# Patient Record
Sex: Female | Born: 1960 | Smoking: Never smoker
Health system: Southern US, Community
[De-identification: ages and names within clinical notes are randomized; demographics above are authoritative.]

## PROBLEM LIST (undated history)

## (undated) DIAGNOSIS — I10 Essential (primary) hypertension: Secondary | ICD-10-CM

## (undated) DIAGNOSIS — Z86711 Personal history of pulmonary embolism: Secondary | ICD-10-CM

## (undated) HISTORY — DX: Personal history of pulmonary embolism: Z86.711

## (undated) HISTORY — DX: Essential (primary) hypertension: I10

---

## 2018-09-08 ENCOUNTER — Ambulatory Visit (INDEPENDENT_AMBULATORY_CARE_PROVIDER_SITE_OTHER): Payer: Managed Care, Other (non HMO)

## 2018-09-08 ENCOUNTER — Other Ambulatory Visit: Payer: Self-pay | Admitting: Podiatry

## 2018-09-08 ENCOUNTER — Encounter: Payer: Self-pay | Admitting: Podiatry

## 2018-09-08 ENCOUNTER — Ambulatory Visit: Payer: Managed Care, Other (non HMO) | Admitting: Podiatry

## 2018-09-08 ENCOUNTER — Other Ambulatory Visit: Payer: Self-pay

## 2018-09-08 VITALS — Resp 16

## 2018-09-08 DIAGNOSIS — M79671 Pain in right foot: Secondary | ICD-10-CM

## 2018-09-08 DIAGNOSIS — M779 Enthesopathy, unspecified: Secondary | ICD-10-CM

## 2018-09-08 DIAGNOSIS — M778 Other enthesopathies, not elsewhere classified: Secondary | ICD-10-CM

## 2018-09-08 DIAGNOSIS — D169 Benign neoplasm of bone and articular cartilage, unspecified: Secondary | ICD-10-CM

## 2018-09-08 MED ORDER — TRIAMCINOLONE ACETONIDE 10 MG/ML IJ SUSP
10.0000 mg | Freq: Once | INTRAMUSCULAR | Status: AC
  Administered 2018-09-08: 10 mg

## 2018-09-08 NOTE — Progress Notes (Signed)
   Subjective:    Patient ID: Dana Donovan, female    DOB: 1960-09-12, 58 y.o.   MRN: [id number]  HPI    Review of Systems  All other systems reviewed and are negative.      Objective:   Physical Exam        Assessment & Plan:

## 2018-09-09 NOTE — Progress Notes (Signed)
Subjective:   Patient ID: Dana Donovan, female   DOB: 58 y.o.   MRN: [id number]   HPI Patient presents stating that she is having a lot of pain on top of her right foot she is had this previously and the last bout has been over the last couple weeks.  States it is very sore and makes it hard to wear shoe gear comfortably and she does not remember injury.  Patient does not smoke and likes to be active   Review of Systems  All other systems reviewed and are negative.       Objective:  Physical Exam Vitals signs and nursing note reviewed.  Constitutional:      Appearance: She is well-developed.  Pulmonary:     Effort: Pulmonary effort is normal.  Musculoskeletal: Normal range of motion.  Skin:    General: Skin is warm.  Neurological:     Mental Status: She is alert.     Neurovascular status intact muscle strength is adequate range of motion within normal limits with patient found to have exquisite discomfort on top of the right foot that is in the midtarsal joint and very inflamed and making it hard to wear shoe gear comfortably.  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

## 2019-03-25 ENCOUNTER — Other Ambulatory Visit: Payer: Self-pay | Admitting: Obstetrics and Gynecology

## 2019-03-25 ENCOUNTER — Other Ambulatory Visit (HOSPITAL_COMMUNITY)
Admission: RE | Admit: 2019-03-25 | Discharge: 2019-03-25 | Disposition: A | Discharge status: Home / Self Care | Payer: Managed Care, Other (non HMO) | Source: Ambulatory Visit | Attending: Obstetrics and Gynecology | Admitting: Obstetrics and Gynecology

## 2019-03-25 DIAGNOSIS — Z01419 Encounter for gynecological examination (general) (routine) without abnormal findings: Secondary | ICD-10-CM | POA: Diagnosis not present

## 2019-03-31 LAB — CYTOLOGY - PAP
Adequacy: ABSENT
Diagnosis: NEGATIVE
High risk HPV: NEGATIVE
Molecular Disclaimer: 56
Molecular Disclaimer: NORMAL

## 2019-05-24 ENCOUNTER — Other Ambulatory Visit: Payer: Self-pay

## 2019-05-24 DIAGNOSIS — Z20822 Contact with and (suspected) exposure to covid-19: Secondary | ICD-10-CM

## 2019-05-26 LAB — NOVEL CORONAVIRUS, NAA: SARS-CoV-2, NAA: NOT DETECTED

## 2019-11-09 ENCOUNTER — Ambulatory Visit: Payer: Managed Care, Other (non HMO) | Admitting: Podiatry

## 2019-11-09 ENCOUNTER — Encounter: Payer: Self-pay | Admitting: Podiatry

## 2019-11-09 ENCOUNTER — Ambulatory Visit (INDEPENDENT_AMBULATORY_CARE_PROVIDER_SITE_OTHER): Payer: Managed Care, Other (non HMO)

## 2019-11-09 ENCOUNTER — Other Ambulatory Visit: Payer: Self-pay

## 2019-11-09 ENCOUNTER — Other Ambulatory Visit: Payer: Self-pay | Admitting: Podiatry

## 2019-11-09 VITALS — Temp 97.3°F

## 2019-11-09 DIAGNOSIS — M79671 Pain in right foot: Secondary | ICD-10-CM

## 2019-11-09 DIAGNOSIS — M778 Other enthesopathies, not elsewhere classified: Secondary | ICD-10-CM

## 2019-11-09 NOTE — Progress Notes (Signed)
Subjective:   Patient ID: Dana Donovan, female   DOB: 59 y.o.   MRN: [id number]   HPI Patient states she did pretty well with the procedure but she needs to have it worked on again as the knot has been painful   ROS      Objective:  Physical Exam  Neurovascular status intact with patient found to have exostosis dorsal midtarsal joint right which is inflamed and she feels like it is slightly worse than previous but it was better for around 8 months      Assessment:  Extensor tendinitis right with bone spur formation     Plan:  H&P reviewed condition reviewed x-rays and today I went ahead and discussed exostectomy and removal of tarsal exostosis.  We will get a hold off currently and I went ahead and I did dorsal injection of the extensor tendon complex 3 mg Kenalog 5 mg Xylocaine advised on diclofenac gel and reappoint to recheck when symptomatic understanding surgery may be necessary at 1 point  X-rays indicate that there is spurring of the midtarsal joint right

## 2020-06-25 ENCOUNTER — Other Ambulatory Visit: Payer: Self-pay | Admitting: Internal Medicine

## 2020-06-25 DIAGNOSIS — Z1231 Encounter for screening mammogram for malignant neoplasm of breast: Secondary | ICD-10-CM

## 2020-08-01 ENCOUNTER — Other Ambulatory Visit: Payer: Self-pay

## 2020-08-01 ENCOUNTER — Ambulatory Visit
Admission: RE | Admit: 2020-08-01 | Discharge: 2020-08-01 | Disposition: A | Discharge status: Home / Self Care | Payer: Managed Care, Other (non HMO) | Source: Ambulatory Visit | Attending: Internal Medicine | Admitting: Internal Medicine

## 2020-08-01 DIAGNOSIS — Z1231 Encounter for screening mammogram for malignant neoplasm of breast: Secondary | ICD-10-CM

## 2021-07-24 ENCOUNTER — Other Ambulatory Visit: Payer: Self-pay | Admitting: Internal Medicine

## 2021-07-24 DIAGNOSIS — Z1231 Encounter for screening mammogram for malignant neoplasm of breast: Secondary | ICD-10-CM

## 2021-08-02 ENCOUNTER — Ambulatory Visit
Admission: RE | Admit: 2021-08-02 | Discharge: 2021-08-02 | Disposition: A | Discharge status: Home / Self Care | Payer: Managed Care, Other (non HMO) | Source: Ambulatory Visit | Attending: Internal Medicine | Admitting: Internal Medicine

## 2021-08-02 DIAGNOSIS — Z1231 Encounter for screening mammogram for malignant neoplasm of breast: Secondary | ICD-10-CM

## 2022-02-13 ENCOUNTER — Ambulatory Visit (INDEPENDENT_AMBULATORY_CARE_PROVIDER_SITE_OTHER): Payer: Managed Care, Other (non HMO)

## 2022-02-13 ENCOUNTER — Ambulatory Visit: Payer: Managed Care, Other (non HMO) | Admitting: Podiatry

## 2022-02-13 DIAGNOSIS — M778 Other enthesopathies, not elsewhere classified: Secondary | ICD-10-CM | POA: Diagnosis not present

## 2022-02-13 DIAGNOSIS — M79671 Pain in right foot: Secondary | ICD-10-CM

## 2022-02-13 MED ORDER — TRIAMCINOLONE ACETONIDE 10 MG/ML IJ SUSP
10.0000 mg | Freq: Once | INTRAMUSCULAR | Status: AC
  Administered 2022-02-13: 10 mg

## 2022-02-14 NOTE — Progress Notes (Signed)
Subjective:   Patient ID: Dana Donovan, female   DOB: 61 y.o.   MRN: [id number]   HPI Patient presents stating she has developed discomfort again on the top of her right foot and states it was good for a relative extended period of time   ROS      Objective:  Physical Exam  Neurovascular status intact with patient found to have inflammation pain of the extensor complex right with fluid buildup noted with moderate swelling     Assessment:  Probability for midfoot arthritis right with extensor tendinitis symptoms     Plan:  H&P x-ray reviewed sterile prep injected the extensor complex 3 mg dexamethasone Kenalog 5 mg Xylocaine advised on wider shoes reduced activity explained risk of injection prior to procedure  X-rays indicate there is quite a bit of roughness in the area with spur formation no indications of significant change from previous x-ray

## 2022-02-26 ENCOUNTER — Other Ambulatory Visit: Payer: Self-pay | Admitting: Podiatry

## 2022-02-26 DIAGNOSIS — M778 Other enthesopathies, not elsewhere classified: Secondary | ICD-10-CM

## 2022-12-10 ENCOUNTER — Ambulatory Visit (INDEPENDENT_AMBULATORY_CARE_PROVIDER_SITE_OTHER): Payer: Managed Care, Other (non HMO)

## 2022-12-10 ENCOUNTER — Ambulatory Visit: Payer: Managed Care, Other (non HMO) | Admitting: Podiatry

## 2022-12-10 ENCOUNTER — Encounter: Payer: Self-pay | Admitting: Podiatry

## 2022-12-10 DIAGNOSIS — M778 Other enthesopathies, not elsewhere classified: Secondary | ICD-10-CM

## 2022-12-10 DIAGNOSIS — M79671 Pain in right foot: Secondary | ICD-10-CM | POA: Diagnosis not present

## 2022-12-10 MED ORDER — TRIAMCINOLONE ACETONIDE 10 MG/ML IJ SUSP
10.0000 mg | Freq: Once | INTRAMUSCULAR | Status: AC
  Administered 2022-12-10: 10 mg

## 2022-12-10 NOTE — Progress Notes (Signed)
Subjective:   Patient ID: Dana Donovan, female   DOB: 62 y.o.   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  Reappoint to recheck as needed

## 2023-03-05 ENCOUNTER — Other Ambulatory Visit (HOSPITAL_COMMUNITY): Payer: Self-pay

## 2023-03-05 ENCOUNTER — Other Ambulatory Visit: Payer: Self-pay

## 2023-03-05 MED ORDER — WEGOVY 1 MG/0.5ML ~~LOC~~ SOAJ
1.0000 mg | SUBCUTANEOUS | 1 refills | Status: DC
Start: 2023-03-05 — End: 2023-07-28
  Filled 2023-03-05 – 2023-03-10 (×3): qty 2, 28d supply, fill #0

## 2023-03-06 ENCOUNTER — Other Ambulatory Visit (HOSPITAL_COMMUNITY): Payer: Self-pay

## 2023-03-10 ENCOUNTER — Other Ambulatory Visit (HOSPITAL_COMMUNITY): Payer: Self-pay

## 2023-03-10 ENCOUNTER — Other Ambulatory Visit: Payer: Self-pay | Admitting: Nurse Practitioner

## 2023-03-10 DIAGNOSIS — Z1231 Encounter for screening mammogram for malignant neoplasm of breast: Secondary | ICD-10-CM

## 2023-03-13 ENCOUNTER — Other Ambulatory Visit: Payer: Self-pay | Admitting: Nurse Practitioner

## 2023-03-13 DIAGNOSIS — Z780 Asymptomatic menopausal state: Secondary | ICD-10-CM

## 2023-04-01 ENCOUNTER — Ambulatory Visit
Admission: RE | Admit: 2023-04-01 | Discharge: 2023-04-01 | Disposition: A | Discharge status: Home / Self Care | Payer: Managed Care, Other (non HMO) | Source: Ambulatory Visit | Attending: Nurse Practitioner | Admitting: Nurse Practitioner

## 2023-04-01 DIAGNOSIS — Z1231 Encounter for screening mammogram for malignant neoplasm of breast: Secondary | ICD-10-CM

## 2023-04-02 ENCOUNTER — Other Ambulatory Visit (HOSPITAL_COMMUNITY): Payer: Self-pay

## 2023-04-02 MED ORDER — WEGOVY 1.7 MG/0.75ML ~~LOC~~ SOAJ
1.7000 mg | SUBCUTANEOUS | 0 refills | Status: DC
  Filled 2023-04-02: qty 3, 28d supply, fill #0

## 2023-04-06 ENCOUNTER — Other Ambulatory Visit: Payer: Self-pay | Admitting: Nurse Practitioner

## 2023-04-06 ENCOUNTER — Other Ambulatory Visit (HOSPITAL_COMMUNITY): Payer: Self-pay

## 2023-04-06 DIAGNOSIS — R928 Other abnormal and inconclusive findings on diagnostic imaging of breast: Secondary | ICD-10-CM

## 2023-04-15 ENCOUNTER — Ambulatory Visit
Admission: RE | Admit: 2023-04-15 | Discharge: 2023-04-15 | Disposition: A | Discharge status: Home / Self Care | Payer: Managed Care, Other (non HMO) | Source: Ambulatory Visit | Attending: Nurse Practitioner | Admitting: Nurse Practitioner

## 2023-04-15 ENCOUNTER — Ambulatory Visit: Payer: Managed Care, Other (non HMO)

## 2023-04-15 DIAGNOSIS — R928 Other abnormal and inconclusive findings on diagnostic imaging of breast: Secondary | ICD-10-CM

## 2023-05-06 ENCOUNTER — Other Ambulatory Visit (HOSPITAL_COMMUNITY): Payer: Self-pay

## 2023-05-06 MED ORDER — WEGOVY 2.4 MG/0.75ML ~~LOC~~ SOAJ
2.4000 mg | SUBCUTANEOUS | 2 refills | Status: DC
  Filled 2023-05-06: qty 3, 28d supply, fill #0
  Filled 2023-06-03: qty 3, 28d supply, fill #1

## 2023-05-07 ENCOUNTER — Other Ambulatory Visit: Payer: Self-pay

## 2023-05-11 ENCOUNTER — Other Ambulatory Visit (HOSPITAL_COMMUNITY): Payer: Self-pay

## 2023-06-05 ENCOUNTER — Other Ambulatory Visit (HOSPITAL_COMMUNITY): Payer: Self-pay

## 2023-06-17 ENCOUNTER — Other Ambulatory Visit (HOSPITAL_COMMUNITY): Payer: Self-pay

## 2023-06-17 MED ORDER — WEGOVY 2.4 MG/0.75ML ~~LOC~~ SOAJ: 2.4000 mg | SUBCUTANEOUS | 2 refills | Status: DC

## 2023-06-30 ENCOUNTER — Telehealth: Payer: Managed Care, Other (non HMO)

## 2023-06-30 ENCOUNTER — Ambulatory Visit: Payer: Managed Care, Other (non HMO)

## 2023-06-30 DIAGNOSIS — U071 COVID-19: Secondary | ICD-10-CM | POA: Diagnosis not present

## 2023-06-30 MED ORDER — COVID-19 AT HOME ANTIGEN TEST VI KIT: PACK | 0 refills | Status: DC

## 2023-06-30 MED ORDER — PROMETHAZINE-DM 6.25-15 MG/5ML PO SYRP: 5.0000 mL | ORAL_SOLUTION | Freq: Four times a day (QID) | ORAL | 0 refills | Status: DC | PRN

## 2023-06-30 MED ORDER — NIRMATRELVIR/RITONAVIR (PAXLOVID)TABLET: 3.0000 | ORAL_TABLET | Freq: Two times a day (BID) | ORAL | 0 refills | Status: DC

## 2023-06-30 MED ORDER — FLUTICASONE PROPIONATE 50 MCG/ACT NA SUSP: 2.0000 | Freq: Every day | NASAL | 0 refills | Status: AC

## 2023-06-30 NOTE — Patient Instructions (Signed)
 Karna Hoar, thank you for joining [name], PA-C for today's virtual visit.  While this provider is not your primary care provider (PCP), if your PCP is located in our provider database this encounter information will be shared with them immediately following your visit.   A [facility] MyChart account gives you access to today's visit and all your visits, tests, and labs performed at [facility]  click here if you don't have a  MyChart account or go to [email or web address]  Consent: (Patient) Dana Donovan provided verbal consent for this virtual visit at the beginning of the encounter.  Current Medications:  Current Outpatient Medications:    COVID-19 At Home Antigen Test KIT, Use as directed., Disp: 2 each, Rfl: 0   fluticasone  (FLONASE ) 50 MCG/ACT nasal spray, Place 2 sprays into both nostrils daily., Disp: 16 g, Rfl: 0   nirmatrelvir /ritonavir  (PAXLOVID ) 20 x 150 MG & 10 x 100MG  TABS, Take 3 tablets by mouth 2 (two) times daily for 5 days. (Take nirmatrelvir  150 mg two tablets twice daily for 5 days and ritonavir  100 mg one tablet twice daily for 5 days), Disp: 30 tablet, Rfl: 0   promethazine -dextromethorphan (PROMETHAZINE -DM) 6.25-15 MG/5ML syrup, Take 5 mLs by mouth 4 (four) times daily as needed., Disp: 118 mL, Rfl: 0   albuterol  (PROVENTIL ) (2.5 MG/3ML) 0.083% nebulizer solution, , Disp: , Rfl:    BREO ELLIPTA 200-25 MCG/INH AEPB, , Disp: , Rfl:    hydrALAZINE  (APRESOLINE ) 25 MG tablet, TK 1 T PO TID WF, Disp: , Rfl:    lisinopril  (PRINIVIL ,ZESTRIL ) 40 MG tablet, , Disp: , Rfl:    oxybutynin  (DITROPAN -XL) 10 MG 24 hr tablet, , Disp: , Rfl:    Semaglutide -Weight Management (WEGOVY ) 1 MG/0.5ML SOAJ, Inject 1 mg into the skin once a week., Disp: 2 mL, Rfl: 1   Semaglutide -Weight Management (WEGOVY ) 1.7 MG/0.75ML SOAJ, Inject 1.7 mg into the skin once a week., Disp: 3 mL, Rfl: 0   Semaglutide -Weight Management (WEGOVY ) 2.4  MG/0.75ML SOAJ, Inject 2.4 mg into the skin once a week., Disp: 3 mL, Rfl: 2   Semaglutide -Weight Management (WEGOVY ) 2.4 MG/0.75ML SOAJ, Inject 2.4 mg into the skin every 7 (seven) days., Disp: 2 mL, Rfl: 2   triamterene -hydrochlorothiazide  (MAXZIDE -25) 37.5-25 MG tablet, Take 1 tablet by mouth daily., Disp: , Rfl:    Medications ordered in this encounter:  Meds ordered this encounter  Medications   nirmatrelvir /ritonavir  (PAXLOVID ) 20 x 150 MG & 10 x 100MG  TABS    Sig: Take 3 tablets by mouth 2 (two) times daily for 5 days. (Take nirmatrelvir  150 mg two tablets twice daily for 5 days and ritonavir  100 mg one tablet twice daily for 5 days)    Dispense:  30 tablet    Refill:  0    Supervising Provider:   BLAISE ALEENE KIDD [[id number]]   promethazine -dextromethorphan (PROMETHAZINE -DM) 6.25-15 MG/5ML syrup    Sig: Take 5 mLs by mouth 4 (four) times daily as needed.    Dispense:  118 mL    Refill:  0    Supervising Provider:   LAMPTEY, PHILIP O [id number]   fluticasone  (FLONASE ) 50 MCG/ACT nasal spray    Sig: Place 2 sprays into both nostrils daily.    Dispense:  16 g    Refill:  0    Supervising Provider:   BLAISE ALEENE KIDD [[id number]]   COVID-19 At Home Antigen Test KIT    Sig: Use as directed.    Dispense:  2 each  Refill:  0    Supervising Provider:   BLAISE ALEENE KIDD [[id number]]     *If you need refills on other medications prior to your next appointment, please contact your pharmacy*  Follow-Up: Call back or seek an in-person evaluation if the symptoms worsen or if the condition fails to improve as anticipated.  [facility] [phone]  Care Instructions: Can take to lessen severity: Vit C 500mg  twice daily Quercertin 250-500mg  twice daily Zinc 75-100mg  daily Melatonin 3-6 mg at bedtime Vit D3 1000-2000 IU daily Aspirin 81 mg daily with food Optional: Famotidine 20mg  daily Also can add tylenol /ibuprofen as needed for fevers and body aches May add Mucinex  or Mucinex DM as needed for cough/congestion    Isolation Instructions: You are to isolate at home until you have been fever free for at least 24 hours without a fever-reducing medication, and symptoms have been steadily improving for 24 hours. At that time,  you can end isolation but need to mask for an additional 5 days.   If you must be around other household members who do not have symptoms, you need to make sure that both you and the family members are masking consistently with a high-quality mask.  If you note any worsening of symptoms despite treatment, please seek an in-person evaluation ASAP. If you note any significant shortness of breath or any chest pain, please seek ER evaluation. Please do not delay care!   COVID-19: What to Do if You Are Sick If you test positive and are an older adult or someone who is at high risk of getting very sick from COVID-19, treatment may be available. Contact a healthcare provider right away after a positive test to determine if you are eligible, even if your symptoms are mild right now. You can also visit a Test to Treat location and, if eligible, receive a prescription from a provider. Don't delay: Treatment must be started within the first few days to be effective. If you have a fever, cough, or other symptoms, you might have COVID-19. Most people have mild illness and are able to recover at home. If you are sick: Keep track of your symptoms. If you have an emergency warning sign (including trouble breathing), call 911. Steps to help prevent the spread of COVID-19 if you are sick If you are sick with COVID-19 or think you might have COVID-19, follow the steps below to care for yourself and to help protect other people in your home and community. Stay home except to get medical care Stay home. Most people with COVID-19 have mild illness and can recover at home without medical care. Do not leave your home, except to get medical care. Do not visit public  areas and do not go to places where you are unable to wear a mask. Take care of yourself. Get rest and stay hydrated. Take over-the-counter medicines, such as acetaminophen , to help you feel better. Stay in touch with your doctor. Call before you get medical care. Be sure to get care if you have trouble breathing, or have any other emergency warning signs, or if you think it is an emergency. Avoid public transportation, ride-sharing, or taxis if possible. Get tested If you have symptoms of COVID-19, get tested. While waiting for test results, stay away from others, including staying apart from those living in your household. Get tested as soon as possible after your symptoms start. Treatments may be available for people with COVID-19 who are at risk for becoming  very sick. Don't delay: Treatment must be started early to be effective--some treatments must begin within 5 days of your first symptoms. Contact your healthcare provider right away if your test result is positive to determine if you are eligible. Self-tests are one of several options for testing for the virus that causes COVID-19 and may be more convenient than laboratory-based tests and point-of-care tests. Ask your healthcare provider or your local health department if you need help interpreting your test results. You can visit your state, tribal, local, and territorial health department's website to look for the latest local information on testing sites. Separate yourself from other people As much as possible, stay in a specific room and away from other people and pets in your home. If possible, you should use a separate bathroom. If you need to be around other people or animals in or outside of the home, wear a well-fitting mask. Tell your close contacts that they may have been exposed to COVID-19. An infected person can spread COVID-19 starting 48 hours (or 2 days) before the person has any symptoms or tests positive. By letting your close  contacts know they may have been exposed to COVID-19, you are helping to protect everyone. See COVID-19 and Animals if you have questions about pets. If you are diagnosed with COVID-19, someone from the health department may call you. Answer the call to slow the spread. Monitor your symptoms Symptoms of COVID-19 include fever, cough, or other symptoms. Follow care instructions from your healthcare provider and local health department. Your local health authorities may give instructions on checking your symptoms and reporting information. When to seek emergency medical attention Look for emergency warning signs* for COVID-19. If someone is showing any of these signs, seek emergency medical care immediately: Trouble breathing Persistent pain or pressure in the chest New confusion Inability to wake or stay awake Pale, gray, or blue-colored skin, lips, or nail beds, depending on skin tone *This list is not all possible symptoms. Please call your medical provider for any other symptoms that are severe or concerning to you. Call 911 or call ahead to your local emergency facility: Notify the operator that you are seeking care for someone who has or may have COVID-19. Call ahead before visiting your doctor Call ahead. Many medical visits for routine care are being postponed or done by phone or telemedicine. If you have a medical appointment that cannot be postponed, call your doctor's office, and tell them you have or may have COVID-19. This will help the office protect themselves and other patients. If you are sick, wear a well-fitting mask You should wear a mask if you must be around other people or animals, including pets (even at home). Wear a mask with the best fit, protection, and comfort for you. You don't need to wear the mask if you are alone. If you can't put on a mask (because of trouble breathing, for example), cover your coughs and sneezes in some other way. Try to stay at least 6 feet away  from other people. This will help protect the people around you. Masks should not be placed on young children under age 64 years, anyone who has trouble breathing, or anyone who is not able to remove the mask without help. Cover your coughs and sneezes Cover your mouth and nose with a tissue when you cough or sneeze. Throw away used tissues in a lined trash can. Immediately wash your hands with soap and water for at least 20 seconds. If  soap and water are not available, clean your hands with an alcohol-based hand sanitizer that contains at least 60% alcohol. Clean your hands often Wash your hands often with soap and water for at least 20 seconds. This is especially important after blowing your nose, coughing, or sneezing; going to the bathroom; and before eating or preparing food. Use hand sanitizer if soap and water are not available. Use an alcohol-based hand sanitizer with at least 60% alcohol, covering all surfaces of your hands and rubbing them together until they feel dry. Soap and water are the best option, especially if hands are visibly dirty. Avoid touching your eyes, nose, and mouth with unwashed hands. Handwashing Tips Avoid sharing personal household items Do not share dishes, drinking glasses, cups, eating utensils, towels, or bedding with other people in your home. Wash these items thoroughly after using them with soap and water or put in the dishwasher. Clean surfaces in your home regularly Clean and disinfect high-touch surfaces (for example, doorknobs, tables, handles, light switches, and countertops) in your sick room and bathroom. In shared spaces, you should clean and disinfect surfaces and items after each use by the person who is ill. If you are sick and cannot clean, a caregiver or other person should only clean and disinfect the area around you (such as your bedroom and bathroom) on an as needed basis. Your caregiver/other person should wait as long as possible (at least  several hours) and wear a mask before entering, cleaning, and disinfecting shared spaces that you use. Clean and disinfect areas that may have blood, stool, or body fluids on them. Use household cleaners and disinfectants. Clean visible dirty surfaces with household cleaners containing soap or detergent. Then, use a household disinfectant. Use a product from [employer] List N: Disinfectants for Coronavirus (COVID-19). Be sure to follow the instructions on the label to ensure safe and effective use of the product. Many products recommend keeping the surface wet with a disinfectant for a certain period of time (look at contact time on the product label). You may also need to wear personal protective equipment, such as gloves, depending on the directions on the product label. Immediately after disinfecting, wash your hands with soap and water for 20 seconds. For completed guidance on cleaning and disinfecting your home, visit Complete Disinfection Guidance. Take steps to improve ventilation at home Improve ventilation (air flow) at home to help prevent from spreading COVID-19 to other people in your household. Clear out COVID-19 virus particles in the air by opening windows, using air filters, and turning on fans in your home. Use this interactive tool to learn how to improve air flow in your home. When you can be around others after being sick with COVID-19 Deciding when you can be around others is different for different situations. Find out when you can safely end home isolation. For any additional questions about your care, contact your healthcare provider or state or local health department. 09/18/2020 Content source: [facility] ([facility]), Division of Viral Diseases This information is not intended to replace advice given to you by your health care provider. Make sure you discuss any questions you have with your health care provider. Document Revised:  11/01/2020 Document Reviewed: 11/01/2020 Elsevier Patient Education  2022 [employer].     If you have been instructed to have an in-person evaluation today at a local Urgent Care facility, please use the link below. It will take you to a list of all of our  available [facility], including address, phone number and hours of operation. Please do not delay care.  [facility]  If you or a family member do not have a primary care provider, use the link below to schedule a visit and establish care. When you choose a [facility] primary care physician or advanced practice provider, you gain a long-term partner in health. Find a Primary Care Provider  Learn more about [facility]'s in-office and virtual care options: [facility] - Get Care Now

## 2023-06-30 NOTE — Progress Notes (Signed)
 Virtual Visit Consent   Dana Donovan, you are scheduled for a virtual visit with a [facility] provider today. Just as with appointments in the office, your consent must be obtained to participate. Your consent will be active for this visit and any virtual visit you may have with one of our providers in the next 365 days. If you have a MyChart account, a copy of this consent can be sent to you electronically.  As this is a virtual visit, video technology does not allow for your provider to perform a traditional examination. This may limit your provider's ability to fully assess your condition. If your provider identifies any concerns that need to be evaluated in person or the need to arrange testing (such as labs, EKG, etc.), we will make arrangements to do so. Although advances in technology are sophisticated, we cannot ensure that it will always work on either your end or our end. If the connection with a video visit is poor, the visit may have to be switched to a telephone visit. With either a video or telephone visit, we are not always able to ensure that we have a secure connection.  By engaging in this virtual visit, you consent to the provision of healthcare and authorize for your insurance to be billed (if applicable) for the services provided during this visit. Depending on your insurance coverage, you may receive a charge related to this service.  I need to obtain your verbal consent now. Are you willing to proceed with your visit today? Dana Donovan has provided verbal consent on 06/30/2023 for a virtual visit (video or telephone). Dana CHRISTELLA Dickinson, PA-C  Date: 06/30/2023 2:30 PM  Virtual Visit via Video Note   I, Dana Donovan, connected with  Dana Donovan  ([id number], 1961-06-12) on 06/30/23 at  2:15 PM EST by a video-enabled telemedicine application and verified that I am speaking with the correct person using two identifiers.  Location: Patient:  Virtual Visit Location Patient: Home Provider: Virtual Visit Location Provider: Home Office   I discussed the limitations of evaluation and management by telemedicine and the availability of in person appointments. The patient expressed understanding and agreed to proceed.    History of Present Illness: Dana Donovan is a 62 y.o. who identifies as a female who was assigned female at birth, and is being seen today for Covid 64.  HPI: URI  This is a new problem. Episode onset: Tested positive for Covid 19 yesterday; Symptoms started about 2 days ago. The problem has been gradually worsening. Maximum temperature: subjective fevers with chills and body aches. The fever has been present for 1 to 2 days. Associated symptoms include congestion, coughing (productive), headaches, a plugged ear sensation, rhinorrhea (and post nasal drainage), sinus pain, a sore throat and wheezing. Pertinent negatives include no chest pain, diarrhea, ear pain, nausea or vomiting. Associated symptoms comments: Body aches, chills, dry mouth. Treatments tried: gargling salt water. The treatment provided no relief.    Has had Covid 19 previously and has used Paxlovid  successfully  Problems: There are no active problems to display for this patient.   Allergies: No Known Allergies Medications:  Current Outpatient Medications:    COVID-19 At Home Antigen Test KIT, Use as directed., Disp: 2 each, Rfl: 0   fluticasone  (FLONASE ) 50 MCG/ACT nasal spray, Place 2 sprays into both nostrils daily., Disp: 16 g, Rfl: 0   nirmatrelvir /ritonavir  (PAXLOVID ) 20 x 150 MG & 10 x 100MG  TABS, Take 3 tablets by mouth 2 (two)  times daily for 5 days. (Take nirmatrelvir  150 mg two tablets twice daily for 5 days and ritonavir  100 mg one tablet twice daily for 5 days), Disp: 30 tablet, Rfl: 0   promethazine -dextromethorphan (PROMETHAZINE -DM) 6.25-15 MG/5ML syrup, Take 5 mLs by mouth 4 (four) times daily as needed., Disp: 118 mL, Rfl: 0    albuterol  (PROVENTIL ) (2.5 MG/3ML) 0.083% nebulizer solution, , Disp: , Rfl:    BREO ELLIPTA 200-25 MCG/INH AEPB, , Disp: , Rfl:    hydrALAZINE  (APRESOLINE ) 25 MG tablet, TK 1 T PO TID WF, Disp: , Rfl:    lisinopril  (PRINIVIL ,ZESTRIL ) 40 MG tablet, , Disp: , Rfl:    oxybutynin  (DITROPAN -XL) 10 MG 24 hr tablet, , Disp: , Rfl:    Semaglutide -Weight Management (WEGOVY ) 1 MG/0.5ML SOAJ, Inject 1 mg into the skin once a week., Disp: 2 mL, Rfl: 1   Semaglutide -Weight Management (WEGOVY ) 1.7 MG/0.75ML SOAJ, Inject 1.7 mg into the skin once a week., Disp: 3 mL, Rfl: 0   Semaglutide -Weight Management (WEGOVY ) 2.4 MG/0.75ML SOAJ, Inject 2.4 mg into the skin once a week., Disp: 3 mL, Rfl: 2   Semaglutide -Weight Management (WEGOVY ) 2.4 MG/0.75ML SOAJ, Inject 2.4 mg into the skin every 7 (seven) days., Disp: 2 mL, Rfl: 2   triamterene -hydrochlorothiazide  (MAXZIDE -25) 37.5-25 MG tablet, Take 1 tablet by mouth daily., Disp: , Rfl:   Observations/Objective: Patient is well-developed, well-nourished in no acute distress.  Resting comfortably at home.  Head is normocephalic, atraumatic.  No labored breathing.  Speech is clear and coherent with logical content.  Patient is alert and oriented at baseline.    Assessment and Plan: 1. COVID-19 (Primary) - nirmatrelvir /ritonavir  (PAXLOVID ) 20 x 150 MG & 10 x 100MG  TABS; Take 3 tablets by mouth 2 (two) times daily for 5 days. (Take nirmatrelvir  150 mg two tablets twice daily for 5 days and ritonavir  100 mg one tablet twice daily for 5 days)  Dispense: 30 tablet; Refill: 0 - promethazine -dextromethorphan (PROMETHAZINE -DM) 6.25-15 MG/5ML syrup; Take 5 mLs by mouth 4 (four) times daily as needed.  Dispense: 118 mL; Refill: 0 - fluticasone  (FLONASE ) 50 MCG/ACT nasal spray; Place 2 sprays into both nostrils daily.  Dispense: 16 g; Refill: 0 - COVID-19 At Home Antigen Test KIT; Use as directed.  Dispense: 2 each; Refill: 0 - MyChart COVID-19 home monitoring program;  Future  - Continue OTC symptomatic management of choice - Will send OTC vitamins and supplement information through AVS - Paxlovid  prescribed - Promethazine  DM for cough - Flonase  for nasal congestion - Advised she can use at home albuterol  inhaler as needed for wheezing and shortness of breath - Patient enrolled in MyChart symptom monitoring - Push fluids - Rest as needed - Discussed return precautions and when to seek in-person evaluation, sent via AVS as well   Follow Up Instructions: I discussed the assessment and treatment plan with the patient. The patient was provided an opportunity to ask questions and all were answered. The patient agreed with the plan and demonstrated an understanding of the instructions.  A copy of instructions were sent to the patient via MyChart unless otherwise noted below.    The patient was advised to call back or seek an in-person evaluation if the symptoms worsen or if the condition fails to improve as anticipated.    Dana CHRISTELLA Dickinson, PA-C

## 2023-07-04 ENCOUNTER — Encounter (HOSPITAL_COMMUNITY): Payer: Self-pay

## 2023-07-04 ENCOUNTER — Other Ambulatory Visit: Payer: Self-pay

## 2023-07-04 ENCOUNTER — Emergency Department: Payer: Managed Care, Other (non HMO)

## 2023-07-04 ENCOUNTER — Emergency Department
Admission: EM | Admit: 2023-07-04 | Discharge: 2023-07-04 | Disposition: A | Discharge status: Home / Self Care | Payer: Managed Care, Other (non HMO) | Attending: Emergency Medicine | Admitting: Emergency Medicine

## 2023-07-04 ENCOUNTER — Inpatient Hospital Stay (HOSPITAL_COMMUNITY)
Admission: EM | Admit: 2023-07-04 | Discharge: 2023-07-10 | DRG: 163 | Disposition: A | Discharge status: Home / Self Care | Payer: Managed Care, Other (non HMO) | Source: Other Acute Inpatient Hospital | Attending: Family Medicine | Admitting: Family Medicine

## 2023-07-04 ENCOUNTER — Encounter: Payer: Self-pay | Admitting: Emergency Medicine

## 2023-07-04 DIAGNOSIS — I110 Hypertensive heart disease with heart failure: Secondary | ICD-10-CM | POA: Diagnosis present

## 2023-07-04 DIAGNOSIS — E871 Hypo-osmolality and hyponatremia: Secondary | ICD-10-CM | POA: Diagnosis present

## 2023-07-04 DIAGNOSIS — I82443 Acute embolism and thrombosis of tibial vein, bilateral: Secondary | ICD-10-CM | POA: Diagnosis present

## 2023-07-04 DIAGNOSIS — J9601 Acute respiratory failure with hypoxia: Secondary | ICD-10-CM | POA: Diagnosis present

## 2023-07-04 DIAGNOSIS — I82453 Acute embolism and thrombosis of peroneal vein, bilateral: Secondary | ICD-10-CM | POA: Diagnosis present

## 2023-07-04 DIAGNOSIS — I50810 Right heart failure, unspecified: Secondary | ICD-10-CM | POA: Diagnosis present

## 2023-07-04 DIAGNOSIS — R630 Anorexia: Secondary | ICD-10-CM | POA: Insufficient documentation

## 2023-07-04 DIAGNOSIS — E872 Acidosis, unspecified: Secondary | ICD-10-CM | POA: Diagnosis present

## 2023-07-04 DIAGNOSIS — I82433 Acute embolism and thrombosis of popliteal vein, bilateral: Secondary | ICD-10-CM | POA: Diagnosis present

## 2023-07-04 DIAGNOSIS — U071 COVID-19: Secondary | ICD-10-CM | POA: Diagnosis not present

## 2023-07-04 DIAGNOSIS — G4733 Obstructive sleep apnea (adult) (pediatric): Secondary | ICD-10-CM | POA: Diagnosis present

## 2023-07-04 DIAGNOSIS — E6609 Other obesity due to excess calories: Secondary | ICD-10-CM

## 2023-07-04 DIAGNOSIS — E66813 Obesity, class 3: Secondary | ICD-10-CM | POA: Diagnosis present

## 2023-07-04 DIAGNOSIS — D649 Anemia, unspecified: Secondary | ICD-10-CM

## 2023-07-04 DIAGNOSIS — U099 Post covid-19 condition, unspecified: Secondary | ICD-10-CM | POA: Diagnosis present

## 2023-07-04 DIAGNOSIS — I2609 Other pulmonary embolism with acute cor pulmonale: Secondary | ICD-10-CM | POA: Diagnosis not present

## 2023-07-04 DIAGNOSIS — R609 Edema, unspecified: Secondary | ICD-10-CM | POA: Diagnosis not present

## 2023-07-04 DIAGNOSIS — I2699 Other pulmonary embolism without acute cor pulmonale: Secondary | ICD-10-CM | POA: Diagnosis present

## 2023-07-04 DIAGNOSIS — I2489 Other forms of acute ischemic heart disease: Secondary | ICD-10-CM | POA: Diagnosis present

## 2023-07-04 DIAGNOSIS — I82413 Acute embolism and thrombosis of femoral vein, bilateral: Secondary | ICD-10-CM | POA: Diagnosis present

## 2023-07-04 DIAGNOSIS — Z79899 Other long term (current) drug therapy: Secondary | ICD-10-CM

## 2023-07-04 DIAGNOSIS — I2692 Saddle embolus of pulmonary artery without acute cor pulmonale: Principal | ICD-10-CM | POA: Diagnosis present

## 2023-07-04 DIAGNOSIS — R739 Hyperglycemia, unspecified: Secondary | ICD-10-CM | POA: Diagnosis present

## 2023-07-04 DIAGNOSIS — Z7985 Long-term (current) use of injectable non-insulin antidiabetic drugs: Secondary | ICD-10-CM

## 2023-07-04 DIAGNOSIS — N179 Acute kidney failure, unspecified: Secondary | ICD-10-CM | POA: Diagnosis present

## 2023-07-04 DIAGNOSIS — I2602 Saddle embolus of pulmonary artery with acute cor pulmonale: Secondary | ICD-10-CM | POA: Diagnosis present

## 2023-07-04 DIAGNOSIS — Z6839 Body mass index (BMI) 39.0-39.9, adult: Secondary | ICD-10-CM | POA: Diagnosis not present

## 2023-07-04 DIAGNOSIS — R7303 Prediabetes: Secondary | ICD-10-CM | POA: Diagnosis present

## 2023-07-04 DIAGNOSIS — I4510 Unspecified right bundle-branch block: Secondary | ICD-10-CM | POA: Diagnosis present

## 2023-07-04 DIAGNOSIS — Z8679 Personal history of other diseases of the circulatory system: Secondary | ICD-10-CM

## 2023-07-04 DIAGNOSIS — I82409 Acute embolism and thrombosis of unspecified deep veins of unspecified lower extremity: Secondary | ICD-10-CM

## 2023-07-04 DIAGNOSIS — E66812 Obesity, class 2: Secondary | ICD-10-CM

## 2023-07-04 DIAGNOSIS — R7989 Other specified abnormal findings of blood chemistry: Secondary | ICD-10-CM | POA: Diagnosis present

## 2023-07-04 DIAGNOSIS — R0602 Shortness of breath: Secondary | ICD-10-CM | POA: Diagnosis present

## 2023-07-04 LAB — CBC
HCT: 40.4 % (ref 36.0–46.0)
Hemoglobin: 13.1 g/dL (ref 12.0–15.0)
MCH: 27.8 pg (ref 26.0–34.0)
MCHC: 32.4 g/dL (ref 30.0–36.0)
MCV: 85.6 fL (ref 80.0–100.0)
Platelets: 131 10*3/uL — ABNORMAL LOW (ref 150–400)
RBC: 4.72 MIL/uL (ref 3.87–5.11)
RDW: 14.1 % (ref 11.5–15.5)
WBC: 13 10*3/uL — ABNORMAL HIGH (ref 4.0–10.5)
nRBC: 0 % (ref 0.0–0.2)

## 2023-07-04 LAB — HEPATIC FUNCTION PANEL
ALT: 24 U/L (ref 0–44)
AST: 29 U/L (ref 15–41)
Albumin: 3.6 g/dL (ref 3.5–5.0)
Alkaline Phosphatase: 78 U/L (ref 38–126)
Bilirubin, Direct: 0.1 mg/dL (ref 0.0–0.2)
Indirect Bilirubin: 0.5 mg/dL (ref 0.3–0.9)
Total Bilirubin: 0.6 mg/dL (ref 0.0–1.2)
Total Protein: 7.6 g/dL (ref 6.5–8.1)

## 2023-07-04 LAB — RESP PANEL BY RT-PCR (RSV, FLU A&B, COVID)  RVPGX2
Influenza A by PCR: NEGATIVE
Influenza B by PCR: NEGATIVE
Resp Syncytial Virus by PCR: NEGATIVE
SARS Coronavirus 2 by RT PCR: POSITIVE — AB

## 2023-07-04 LAB — LACTIC ACID, PLASMA: Lactic Acid, Venous: 4.2 mmol/L (ref 0.5–1.9)

## 2023-07-04 LAB — BASIC METABOLIC PANEL
Anion gap: 15 (ref 5–15)
BUN: 30 mg/dL — ABNORMAL HIGH (ref 8–23)
CO2: 20 mmol/L — ABNORMAL LOW (ref 22–32)
Calcium: 8.7 mg/dL — ABNORMAL LOW (ref 8.9–10.3)
Chloride: 96 mmol/L — ABNORMAL LOW (ref 98–111)
Creatinine, Ser: 1.57 mg/dL — ABNORMAL HIGH (ref 0.44–1.00)
GFR, Estimated: 37 mL/min — ABNORMAL LOW (ref >60)
Glucose, Bld: 235 mg/dL — ABNORMAL HIGH (ref 70–99)
Potassium: 3.6 mmol/L (ref 3.5–5.1)
Sodium: 131 mmol/L — ABNORMAL LOW (ref 135–145)

## 2023-07-04 LAB — D-DIMER, QUANTITATIVE: D-Dimer, Quant: >20 ug{FEU}/mL — ABNORMAL HIGH (ref 0.00–0.50)

## 2023-07-04 LAB — TROPONIN I (HIGH SENSITIVITY)
Troponin I (High Sensitivity): 174 ng/L (ref <18)
Troponin I (High Sensitivity): 201 ng/L (ref <18)

## 2023-07-04 LAB — BRAIN NATRIURETIC PEPTIDE: B Natriuretic Peptide: 2240.2 pg/mL — ABNORMAL HIGH (ref 0.0–100.0)

## 2023-07-04 LAB — PROTIME-INR
INR: 1.1 (ref 0.8–1.2)
Prothrombin Time: 14 s (ref 11.4–15.2)

## 2023-07-04 LAB — APTT: aPTT: 26 s (ref 24–36)

## 2023-07-04 MED ORDER — HEPARIN BOLUS VIA INFUSION
4700.0000 [IU] | Freq: Once | INTRAVENOUS | Status: AC
  Administered 2023-07-04: 4700 [IU] via INTRAVENOUS
  Filled 2023-07-04: qty 4700

## 2023-07-04 MED ORDER — SODIUM CHLORIDE 0.9 % IV BOLUS: 1000.0000 mL | Freq: Once | INTRAVENOUS | Status: DC

## 2023-07-04 MED ORDER — SODIUM CHLORIDE 0.9 % IV SOLN
250.0000 mL | Freq: Once | INTRAVENOUS | Status: AC
  Administered 2023-07-04: 250 mL via INTRAVENOUS

## 2023-07-04 MED ORDER — HEPARIN (PORCINE) 25000 UT/250ML-% IV SOLN: 800.0000 [IU]/h | INTRAVENOUS | Status: DC

## 2023-07-04 MED ORDER — SODIUM CHLORIDE 0.9 % IV SOLN
500.0000 mg | Freq: Once | INTRAVENOUS | Status: AC
  Administered 2023-07-04: 500 mg via INTRAVENOUS
  Filled 2023-07-04: qty 5

## 2023-07-04 MED ORDER — SODIUM CHLORIDE 0.9 % IV SOLN
2.0000 g | Freq: Once | INTRAVENOUS | Status: AC
  Administered 2023-07-04: 2 g via INTRAVENOUS
  Filled 2023-07-04: qty 20

## 2023-07-04 MED ORDER — SODIUM CHLORIDE 0.9 % IV SOLN: 250.0000 mL | Freq: Once | INTRAVENOUS | Status: DC

## 2023-07-04 MED ORDER — ALTEPLASE (ACTIVASE) 10MG BOLUS + 40 MG INFUSION
50.0000 mg | Freq: Once | INTRAVENOUS | Status: DC
  Filled 2023-07-04: qty 50

## 2023-07-04 MED ORDER — HEPARIN (PORCINE) 25000 UT/250ML-% IV SOLN
1150.0000 [IU]/h | INTRAVENOUS | Status: DC
  Administered 2023-07-04: 1150 [IU]/h via INTRAVENOUS
  Filled 2023-07-04: qty 250

## 2023-07-04 MED ORDER — IOHEXOL 350 MG/ML SOLN
75.0000 mL | Freq: Once | INTRAVENOUS | Status: AC | PRN
  Administered 2023-07-04: 75 mL via INTRAVENOUS

## 2023-07-04 MED ORDER — SODIUM CHLORIDE 0.9 % IV BOLUS
1000.0000 mL | Freq: Once | INTRAVENOUS | Status: AC
Start: 2023-07-04 — End: 2023-07-04
  Administered 2023-07-04: 1000 mL via INTRAVENOUS

## 2023-07-04 MED ORDER — METHYLPREDNISOLONE SODIUM SUCC 125 MG IJ SOLR
125.0000 mg | Freq: Once | INTRAMUSCULAR | Status: AC
  Administered 2023-07-04: 125 mg via INTRAVENOUS
  Filled 2023-07-04: qty 2

## 2023-07-04 MED ORDER — ALTEPLASE (ACTIVASE) 10MG BOLUS + 40 MG INFUSION
50.0000 mg | Freq: Once | INTRAVENOUS | Status: DC
  Administered 2023-07-04: 50 mg via INTRAVENOUS
  Filled 2023-07-04 (×2): qty 50

## 2023-07-04 NOTE — Progress Notes (Signed)
 PHARMACY - ANTICOAGULATION CONSULT NOTE  Pharmacy Consult for Heparin  Drip post TPA Indication: pulmonary embolus  No Known Allergies  Patient Measurements: Height: 5' (152.4 cm) Weight: 90.3 kg (199 lb) IBW/kg (Calculated) : 45.5 Heparin  Dosing Weight: 66.9 kg  Vital Signs: Temp: 98 F (36.7 C) (01/04 1929) Temp Source: Oral (01/04 1929) BP: 86/67 (01/04 2230) Pulse Rate: 122 (01/04 2230)  Labs: Recent Labs    07/04/23 1937 07/04/23 2022  HGB 13.1  --   HCT 40.4  --   PLT 131*  --   APTT  --  26  LABPROT  --  14.0  INR  --  1.1  CREATININE 1.57*  --   TROPONINIHS 174* 201*    Estimated Creatinine Clearance: 36.7 mL/min (A) (by C-G formula based on SCr of 1.57 mg/dL (H)).   Medical History: History reviewed. No pertinent past medical history.  Assessment: Patient is a 63yo female that recently tested positive for Covid. Presented to ED with worsening SOB. CT revealed PE. Pharmacy consulted for Heparin  dosing.  Goal of Therapy:  Heparin  level 0.3-0.7 units/ml Monitor platelets by anticoagulation protocol: Yes   Plan:  1/4:  Heparin  4700 units IV X 1 bolus given on 1/4 @ 2151. Heparin  gtt then started @ 2151 at 1150 units/hr. - MD ordered TPA for PE.  Heparin  gtt stopped at 2224. - Alteplase  50 mg IV started at 2245. - aPTT ordered for 1/5 @ 0115 (~ 30 min post TPA) - per protocol will need to resume heparin  drip at 800 units/hr once aPTT < 80. - will dose per protocol after heparin  gtt resumed.  Tola Meas D 07/04/2023 11:30 PM

## 2023-07-04 NOTE — ED Notes (Signed)
 CCMD notified that pt is being transferred and is being taken off of [facility] monitors

## 2023-07-04 NOTE — ED Notes (Signed)
 Pharmacy called this RN to discontinue  the heparin infusion. PT will receive thrombolytic and heparin will be restarted after the thrombolytic level is below a specific threshold.

## 2023-07-04 NOTE — ED Notes (Addendum)
 Bedside SBAR hand off report given to [facility] RN, Raiford Noble.   Pt Dx, Hx, Sx, current condition, medications administered, lines, labs, and scans discussed. All questions asked were answered.

## 2023-07-04 NOTE — ED Provider Notes (Signed)
 [facility] Provider Note    Event Date/Time   First MD Initiated Contact with Patient 07/04/23 2004     (approximate)   History   Shortness of Breath   HPI  Dana Donovan is a 63 y.o. female  here with SOB. Pt reports that over the past several days, she has had progressively worsening cough, SOB. She took a home covid test that was positive. She has had worsening SOB and was essentially unable to even get around the house today. She feels weak, fatigued. She has had poor appetite. She has sick contacts w/ her husband. No abd pain. No leg swelling.      Physical Exam   Triage Vital Signs: ED Triage Vitals  Encounter Vitals Group     BP 07/04/23 1929 133/88     Systolic BP Percentile --      Diastolic BP Percentile --      Pulse Rate 07/04/23 1929 (!) 140     Resp 07/04/23 1929 (!) 25     Temp 07/04/23 1929 98 F (36.7 C)     Temp Source 07/04/23 1929 Oral     SpO2 07/04/23 1929 94 %     Weight 07/04/23 1930 199 lb (90.3 kg)     Height 07/04/23 1930 5' (1.524 m)     Head Circumference --      Peak Flow --      Pain Score 07/04/23 1930 9     Pain Loc --      Pain Education --      Exclude from Growth Chart --     Most recent vital signs: Vitals:   07/04/23 2200 07/04/23 2230  BP: (!) 108/90 (!) 86/67  Pulse: (!) 125 (!) 122  Resp: (!) 22 (!) 27  Temp:    SpO2: 96% 95%     General: Awake, in mild resp distress CV:  Good peripheral perfusion. Tachycardic. Resp:  Tachypneic with bilateral rales, speaking in short sentences. Abd:  No distention. No tenderness. Other:  Mildly dry MM   ED Results / Procedures / Treatments   Labs (all labs ordered are listed, but only abnormal results are displayed) Labs Reviewed  RESP PANEL BY RT-PCR (RSV, FLU A&B, COVID)  [id number] - Abnormal; Notable for the following components:      Result Value   SARS Coronavirus 2 by RT PCR POSITIVE (*)    All other components within normal limits   BASIC METABOLIC PANEL - Abnormal; Notable for the following components:   Sodium 131 (*)    Chloride 96 (*)    CO2 20 (*)    Glucose, Bld 235 (*)    BUN 30 (*)    Creatinine, Ser 1.57 (*)    Calcium 8.7 (*)    GFR, Estimated 37 (*)    All other components within normal limits  CBC - Abnormal; Notable for the following components:   WBC 13.0 (*)    Platelets 131 (*)    All other components within normal limits  LACTIC ACID, PLASMA - Abnormal; Notable for the following components:   Lactic Acid, Venous 4.2 (*)    All other components within normal limits  D-DIMER, QUANTITATIVE - Abnormal; Notable for the following components:   D-Dimer, Quant >20.00 (*)    All other components within normal limits  BRAIN NATRIURETIC PEPTIDE - Abnormal; Notable for the following components:   B Natriuretic Peptide 2,240.2 (*)    All other components within normal  limits  TROPONIN I (HIGH SENSITIVITY) - Abnormal; Notable for the following components:   Troponin I (High Sensitivity) 174 (*)    All other components within normal limits  TROPONIN I (HIGH SENSITIVITY) - Abnormal; Notable for the following components:   Troponin I (High Sensitivity) 201 (*)    All other components within normal limits  CULTURE, BLOOD (ROUTINE X 2)  CULTURE, BLOOD (ROUTINE X 2)  HEPATIC FUNCTION PANEL  APTT  PROTIME-INR  LACTIC ACID, PLASMA  PROCALCITONIN  CBC     EKG Sinus tachycardia, VR 129. PR 132, QRS 140, QTc 553. No acute ST elevations. RBBB.   RADIOLOGY CXR: R perihilar infiltrate CT Angio: On my prelim review, large saddle pe with RV:LV of at least 1.6.    I also independently reviewed and agree with radiologist interpretations.   PROCEDURES:  Critical Care performed: Yes, see critical care procedure note(s)  .Critical Care  Performed by: Angelena Smalls, MD Authorized by: Angelena Smalls, MD   Critical care provider statement:    Critical care time (minutes):  30   Critical care time  was exclusive of:  Separately billable procedures and treating other patients   Critical care was necessary to treat or prevent imminent or life-threatening deterioration of the following conditions:  Cardiac failure, circulatory failure and respiratory failure   Critical care was time spent personally by me on the following activities:  Development of treatment plan with patient or surrogate, discussions with consultants, evaluation of patient's response to treatment, examination of patient, ordering and review of laboratory studies, ordering and review of radiographic studies, ordering and performing treatments and interventions, pulse oximetry, re-evaluation of patient's condition and review of old charts     MEDICATIONS ORDERED IN ED: Medications  0.9 %  sodium chloride  infusion (has no administration in time range)  alteplase  (ACTIVASE ) 10 mg bolus + 40 mg infusion (50 mg Intravenous New Bag/Given 07/04/23 2245)  heparin  ADULT infusion 100 units/mL (25000 units/250mL) (has no administration in time range)  sodium chloride  0.9 % bolus 1,000 mL (has no administration in time range)  methylPREDNISolone  sodium succinate (SOLU-MEDROL ) 125 mg/2 mL injection 125 mg (125 mg Intravenous Given 07/04/23 2032)  cefTRIAXone  (ROCEPHIN ) 2 g in sodium chloride  0.9 % 100 mL IVPB (0 g Intravenous Stopped 07/04/23 2152)  azithromycin  (ZITHROMAX ) 500 mg in sodium chloride  0.9 % 250 mL IVPB (0 mg Intravenous Stopped 07/04/23 2217)  iohexol  (OMNIPAQUE ) 350 MG/ML injection 75 mL (75 mLs Intravenous Contrast Given 07/04/23 2111)  sodium chloride  0.9 % bolus 1,000 mL (1,000 mLs Intravenous New Bag/Given 07/04/23 2055)  heparin  bolus via infusion 4,700 Units (4,700 Units Intravenous Bolus from Bag 07/04/23 2151)     IMPRESSION / MDM / ASSESSMENT AND PLAN / ED COURSE  I reviewed the triage vital signs and the nursing notes.                              Differential diagnosis includes, but is not limited to, COVID-19 with  hypoxia, superimposed bacterial PNA, PE, ACS, CHF  Patient's presentation is most consistent with acute presentation with potential threat to life or bodily function.  The patient is on the cardiac monitor to evaluate for evidence of arrhythmia and/or significant heart rate changes  63 year old female here with cough, chest pain, shortness of breath.  Patient recently diagnosed with COVID-19.  On arrival, patient has sinus tachycardia, right bundle branch block, and hypoxia.  Concern for possible PE.  Chest x-ray shows right midlung infiltrate.  Broad spectrum coverage for possible pneumonia started, and patient sent for stat CT scan.  CT angio shows large, saddle PE, with 31 RV to LV ratio and signs of right heart strain.  Patient also has elevated troponin, hypoxia, and symptoms highly consistent with submassive PE.  She also has a lactic acid elevated at 4.2.  I discussed the case with vascular surgery as well as pulmonary/critical care.  Decision made to give a dose of systemic tPA here, with transfer to [facility] as vascular is not available here at this time.  Patient will be transferred to the ICU emergently.  Patient counseled on risks and benefits of tPA.  She has no history of GI bleed, recent surgery, head bleed, or relative contraindications and given the significance of her PE with signs of hemodynamic compromise, feel the benefits outweigh the risk.  IV fluids started.    FINAL CLINICAL IMPRESSION(S) / ED DIAGNOSES   Final diagnoses:  Acute massive pulmonary embolism (HCC)  COVID-19  Acute respiratory failure with hypoxia (HCC)     Rx / DC Orders   ED Discharge Orders     None        Note:  This document was prepared using Dragon voice recognition software and may include unintentional dictation errors.   Angelena Smalls, MD 07/04/23 2252

## 2023-07-04 NOTE — Progress Notes (Signed)
 PHARMACY - ANTICOAGULATION CONSULT NOTE  Pharmacy Consult for Heparin  Drip Indication: pulmonary embolus  No Known Allergies  Patient Measurements: Height: 5' (152.4 cm) Weight: 90.3 kg (199 lb) IBW/kg (Calculated) : 45.5 Heparin  Dosing Weight: 66.9 kg  Vital Signs: Temp: 98 F (36.7 C) (01/04 1929) Temp Source: Oral (01/04 1929) BP: 132/97 (01/04 2042) Pulse Rate: 136 (01/04 2008)  Labs: Recent Labs    07/04/23 1937  HGB 13.1  HCT 40.4  PLT 131*  CREATININE 1.57*  TROPONINIHS 174*    Estimated Creatinine Clearance: 36.7 mL/min (A) (by C-G formula based on SCr of 1.57 mg/dL (H)).   Medical History: History reviewed. No pertinent past medical history.  Assessment: Patient is a 63yo female that recently tested positive for Covid. Presented to ED with worsening SOB. CT revealed PE. Pharmacy consulted for Heparin  dosing.  Goal of Therapy:  Heparin  level 0.3-0.7 units/ml Monitor platelets by anticoagulation protocol: Yes   Plan:  Give 4700 units bolus x 1 Start heparin  infusion at 1150 units/hr Check anti-Xa level in 8 hours and daily while on heparin  Continue to monitor H&H and platelets  Olam Fritter, PharmD, BCPS 07/04/2023 9:34 PM

## 2023-07-04 NOTE — H&P (Signed)
 NAME:  Dana Donovan, MRN:  [id number], DOB:  1960/09/07, LOS: 0 ADMISSION DATE:  (Not on file), CONSULTATION DATE:  1/4 REFERRING MD:  Angelena, CHIEF COMPLAINT:  submassive PE    History of Present Illness:  63 year old female w/ h/o obesity, HTN, OSA,  Recent (+) COVID (Jun 30 2023). Treated w/ paxlovid  and Phenergan  for nausea. Brought in to ER on   w/ cc: increased shortness of breath, fatigue and then a syncopal episode on Jan 4 while sitting up in chair. EMS arrival sats 89-90% room air. Initial HR 140s, BP 133/88, sats 94% on 4 lpm.   Initial lab findings: d dimer > 20,  Na 131, CO2 20, cr 1.57, Trop I 174 up to  201, lactate > 4, BNP > 2000 CT angio saddle PE w/ large clot burden and RV/LV ration of 3 PESI score: 103  dexamethasone at a dose of 6 mg daily for 10 days  Pertinent  Medical History  Obesity (on Wegovy ), HTN, OSA Significant Hospital Events: Including procedures, antibiotic start and stop dates in addition to other pertinent events   Admitted s/p recent dx COVID 12/31. Treated Paxlovid . Comes in w/ submassive PE s/p syncopal episode. 1/2 dose Alteplase  administered in ER.   Interim History / Subjective:  [facility].  Feels better CP resolved Objective   There were no vitals taken for this visit.    FiO2 (%):  [35 %] 35 %  No intake or output data in the 24 hours ending 07/04/23 2245 There were no vitals filed for this visit.  Examination: General: 63 year old female sitting up in bed no distress on heated high flow 35 LPM 35% HENT: NCAT no JVD Lungs: clear dec bases no accessory use speaking in full sentences Cardiovascular: tachy rrr Abdomen: soft Extremities: warm strong pulse no pain trace pedal edema Neuro: oriented  GU: due to void  Resolved Hospital Problem list     Assessment & Plan:  Acute hypoxic respiratory failure secondary to provoked submassive pulmonary emboli and lactic acidosis  -PESI score intermediate risk at  103, but had + trops, RV/LV ratio of 3 and syncopal episode. No vascular surg coverage for thrombectomy. PCCM called. Given presentation and risk  dose TPA Alteplase  administered.  Plan Admit to ICU to [facility] in case needs mechanical support  Heparin  per pharmacy  f/u echo and LEUS MAP goal > 65. If pressor needed will start shock dose vasopressin as will have least adverse effect on pulm vascular  Wean FIO2 for sats > 92%  AKI suspect 2/2 relative volume depletion from COVID and dec'd oral intake +/- organ hypoperfusion Plan Cont MIVFs w/ LR Strict I&O Renal dose meds Am chem   Hyponatremia. (hypoosmolar even w/ elevated glucose) suspect hypovolemic related Plan Cont volume resuscitation  Am chem   COVID infection  Plan Resp isolation  Will hold off on steroids as hypoxia likely driven by the PE  hyperglycemia Plan  Ssi  Goal 140-180  h/o HTN Plan Hold antihypertensives   Best Practice (right click and Reselect all SmartList Selections daily)   Diet/type: clear liquids DVT prophylaxis systemic heparin  Pressure ulcer(s): N/A GI prophylaxis: N/A Lines: N/A Foley:  N/A Code Status:  full code Last date of multidisciplinary goals of care discussion [full code]  Labs   CBC: Recent Labs  Lab 07/04/23 1937  WBC 13.0*  HGB 13.1  HCT 40.4  MCV 85.6  PLT 131*    Basic Metabolic Panel:  Recent Labs  Lab 07/04/23 1937  NA 131*  K 3.6  CL 96*  CO2 20*  GLUCOSE 235*  BUN 30*  CREATININE 1.57*  CALCIUM 8.7*   GFR: Estimated Creatinine Clearance: 36.7 mL/min (A) (by C-G formula based on SCr of 1.57 mg/dL (H)). Recent Labs  Lab 07/04/23 1937 07/04/23 2022  WBC 13.0*  --   LATICACIDVEN  --  4.2*    Liver Function Tests: Recent Labs  Lab 07/04/23 2022  AST 29  ALT 24  ALKPHOS 78  BILITOT 0.6  PROT 7.6  ALBUMIN 3.6   No results for input(s): LIPASE, AMYLASE in the last 168 hours. No results for input(s): AMMONIA in the last 168  hours.  ABG No results found for: PHART, PCO2ART, PO2ART, HCO3, TCO2, ACIDBASEDEF, O2SAT   Coagulation Profile: Recent Labs  Lab 07/04/23 2022  INR 1.1    Cardiac Enzymes: No results for input(s): CKTOTAL, CKMB, CKMBINDEX, TROPONINI in the last 168 hours.  HbA1C: No results found for: HGBA1C  CBG: No results for input(s): GLUCAP in the last 168 hours.  Review of Systems:   Review of Systems  Constitutional:  Positive for malaise/fatigue.  HENT:  Positive for congestion.   Eyes: Negative.   Respiratory:  Positive for cough, shortness of breath and wheezing. Negative for sputum production.   Cardiovascular:  Positive for chest pain.  Gastrointestinal: Negative.   Genitourinary: Negative.   Musculoskeletal: Negative.   Skin: Negative.   Endo/Heme/Allergies: Negative.      Past Medical History:  She,  has no past medical history on file.   Surgical History:  No past surgical history on file.   Social History:   reports that she has never smoked. She has never used smokeless tobacco. She reports that she does not drink alcohol and does not use drugs.   Family History:  Her family history is negative for Breast cancer.   Allergies No Known Allergies   Home Medications  Prior to Admission medications   Medication Sig Start Date End Date Taking? Authorizing Provider  albuterol  (PROVENTIL ) (2.5 MG/3ML) 0.083% nebulizer solution  05/30/18   [provider]  Dana Donovan 200-25 MCG/INH AEPB  05/17/18   [provider]  COVID-19 At Home Antigen Test KIT Use as directed. 06/30/23   Dana Delon HERO, PA-C  fluticasone  (FLONASE ) 50 MCG/ACT nasal spray Place 2 sprays into both nostrils daily. 06/30/23   Dana Delon HERO, PA-C  hydrALAZINE  (APRESOLINE ) 25 MG tablet TK 1 T PO TID WF 08/25/18   [provider]  lisinopril  (PRINIVIL ,ZESTRIL ) 40 MG tablet  06/22/18   [provider]  nirmatrelvir /ritonavir   (PAXLOVID ) 20 x 150 MG & 10 x 100MG  TABS Take 3 tablets by mouth 2 (two) times daily for 5 days. (Take nirmatrelvir  150 mg two tablets twice daily for 5 days and ritonavir  100 mg one tablet twice daily for 5 days) 06/30/23 07/05/23  Dana Delon HERO, PA-C  oxybutynin  (DITROPAN -XL) 10 MG 24 hr tablet  05/17/18   [provider]  promethazine -dextromethorphan (PROMETHAZINE -DM) 6.25-15 MG/5ML syrup Take 5 mLs by mouth 4 (four) times daily as needed. 06/30/23   Dana Delon HERO, PA-C  Semaglutide -Weight Management (WEGOVY ) 1 MG/0.5ML SOAJ Inject 1 mg into the skin once a week. 03/05/23     Semaglutide -Weight Management (WEGOVY ) 1.7 MG/0.75ML SOAJ Inject 1.7 mg into the skin once a week. 04/02/23     Semaglutide -Weight Management (WEGOVY ) 2.4 MG/0.75ML SOAJ Inject 2.4 mg into the skin once a week. 05/06/23  Semaglutide -Weight Management (WEGOVY ) 2.4 MG/0.75ML SOAJ Inject 2.4 mg into the skin every 7 (seven) days. 06/17/23     triamterene -hydrochlorothiazide  (MAXZIDE -25) 37.5-25 MG tablet Take 1 tablet by mouth daily. 10/17/19   [provider]     Critical care time: 35 min

## 2023-07-04 NOTE — ED Notes (Signed)
 This RN called transfer of facility report to [facility] on [address] at [phone] .  Pt Dx, Hx, Sx, current condition, medications administered, lines, labs, and scans discussed.  All questions asked were answered.

## 2023-07-04 NOTE — ED Notes (Signed)
 Pharmacy reports they are mixing and delivering thrombolytic at this time.    Dosing orders are "instructions are to bolus 10 mg from syringe over 1 min, then infuse the rest of the drug over 2 hrs" per pharmacist message.

## 2023-07-04 NOTE — ED Triage Notes (Signed)
 Pt BIB [facility] from home d/t increasing SOB. Began have URI s/s Wednesday. Had a + at home COVID test. Was seen via e-visit Friday 12/31. Has been taking paxlovid  and promethazine  without improvement. Reports worsening SOB even at rest worsening with exertion. Sts Husband called EMS today after she had a syncopal episode while sitting in the chair. EMS reports pt was 89-90% on room air. Is currently on 4L nasal cannula - 93-94%.

## 2023-07-04 NOTE — ED Notes (Signed)
 This RN administered heparin  IV per MD order.  MD asked this RN after heparin  bolus was complete whether heparin  was administered, RN verified that it was.  MD asked this RN to contact pharmacist to clarify appropriate thrombolytic dose r/t recent heparin  administration.   This RN called pharmacy and explained situation, pharmacist stated they would return call to primary nurse.

## 2023-07-04 NOTE — ED Notes (Signed)
 EMTALA reviewed by this RN.

## 2023-07-05 ENCOUNTER — Other Ambulatory Visit (HOSPITAL_COMMUNITY): Payer: Managed Care, Other (non HMO)

## 2023-07-05 ENCOUNTER — Encounter (HOSPITAL_COMMUNITY): Payer: Managed Care, Other (non HMO)

## 2023-07-05 DIAGNOSIS — I2699 Other pulmonary embolism without acute cor pulmonale: Principal | ICD-10-CM | POA: Diagnosis present

## 2023-07-05 LAB — CBC
HCT: 40.2 % (ref 36.0–46.0)
Hemoglobin: 13.1 g/dL (ref 12.0–15.0)
MCH: 27.5 pg (ref 26.0–34.0)
MCHC: 32.6 g/dL (ref 30.0–36.0)
MCV: 84.3 fL (ref 80.0–100.0)
Platelets: 114 10*3/uL — ABNORMAL LOW (ref 150–400)
RBC: 4.77 MIL/uL (ref 3.87–5.11)
RDW: 14.1 % (ref 11.5–15.5)
WBC: 14.9 10*3/uL — ABNORMAL HIGH (ref 4.0–10.5)
nRBC: 0.1 % (ref 0.0–0.2)

## 2023-07-05 LAB — HIV ANTIBODY (ROUTINE TESTING W REFLEX): HIV Screen 4th Generation wRfx: NONREACTIVE

## 2023-07-05 LAB — APTT: aPTT: 50 s — ABNORMAL HIGH (ref 24–36)

## 2023-07-05 LAB — GLUCOSE, CAPILLARY
Glucose-Capillary: 127 mg/dL — ABNORMAL HIGH (ref 70–99)
Glucose-Capillary: 127 mg/dL — ABNORMAL HIGH (ref 70–99)
Glucose-Capillary: 129 mg/dL — ABNORMAL HIGH (ref 70–99)
Glucose-Capillary: 143 mg/dL — ABNORMAL HIGH (ref 70–99)
Glucose-Capillary: 147 mg/dL — ABNORMAL HIGH (ref 70–99)

## 2023-07-05 LAB — MRSA NEXT GEN BY PCR, NASAL: MRSA by PCR Next Gen: NOT DETECTED

## 2023-07-05 LAB — BASIC METABOLIC PANEL
Anion gap: 15 (ref 5–15)
BUN: 28 mg/dL — ABNORMAL HIGH (ref 8–23)
CO2: 19 mmol/L — ABNORMAL LOW (ref 22–32)
Calcium: 8.4 mg/dL — ABNORMAL LOW (ref 8.9–10.3)
Chloride: 102 mmol/L (ref 98–111)
Creatinine, Ser: 1.33 mg/dL — ABNORMAL HIGH (ref 0.44–1.00)
GFR, Estimated: 45 mL/min — ABNORMAL LOW (ref >60)
Glucose, Bld: 114 mg/dL — ABNORMAL HIGH (ref 70–99)
Potassium: 3.8 mmol/L (ref 3.5–5.1)
Sodium: 136 mmol/L (ref 135–145)

## 2023-07-05 LAB — LACTIC ACID, PLASMA
Lactic Acid, Venous: 4.3 mmol/L (ref 0.5–1.9)
Lactic Acid, Venous: 3.5 mmol/L (ref 0.5–1.9)

## 2023-07-05 LAB — PROTIME-INR
INR: 1.3 — ABNORMAL HIGH (ref 0.8–1.2)
Prothrombin Time: 16.6 s — ABNORMAL HIGH (ref 11.4–15.2)

## 2023-07-05 LAB — PROCALCITONIN: Procalcitonin: <0.1 ng/mL

## 2023-07-05 LAB — HEPARIN LEVEL (UNFRACTIONATED)
Heparin Unfractionated: 0.53 [IU]/mL (ref 0.30–0.70)
Heparin Unfractionated: 0.41 [IU]/mL (ref 0.30–0.70)

## 2023-07-05 MED ORDER — POLYETHYLENE GLYCOL 3350 17 G PO PACK: 17.0000 g | PACK | Freq: Every day | ORAL | Status: DC | PRN

## 2023-07-05 MED ORDER — HEPARIN (PORCINE) 25000 UT/250ML-% IV SOLN
1150.0000 [IU]/h | INTRAVENOUS | Status: AC
  Administered 2023-07-05: 900 [IU]/h via INTRAVENOUS
  Administered 2023-07-06 – 2023-07-09 (×3): 850 [IU]/h via INTRAVENOUS
  Administered 2023-07-10: 1150 [IU]/h via INTRAVENOUS
  Filled 2023-07-05 (×5): qty 250

## 2023-07-05 MED ORDER — FLUTICASONE PROPIONATE 50 MCG/ACT NA SUSP
2.0000 | Freq: Every day | NASAL | Status: DC
  Administered 2023-07-05 – 2023-07-10 (×6): 2 via NASAL
  Filled 2023-07-05: qty 16

## 2023-07-05 MED ORDER — SODIUM CHLORIDE 0.9 % IV SOLN
250.0000 mL | Freq: Once | INTRAVENOUS | Status: AC
  Administered 2023-07-05: 250 mL via INTRAVENOUS

## 2023-07-05 MED ORDER — INSULIN ASPART 100 UNIT/ML IJ SOLN
0.0000 [IU] | Freq: Three times a day (TID) | INTRAMUSCULAR | Status: DC
  Administered 2023-07-05 – 2023-07-09 (×6): 2 [IU] via SUBCUTANEOUS

## 2023-07-05 MED ORDER — CHLORHEXIDINE GLUCONATE CLOTH 2 % EX PADS
6.0000 | MEDICATED_PAD | Freq: Every day | CUTANEOUS | Status: DC
  Administered 2023-07-05: 6 via TOPICAL

## 2023-07-05 MED ORDER — ALBUTEROL SULFATE (2.5 MG/3ML) 0.083% IN NEBU
2.5000 mg | INHALATION_SOLUTION | Freq: Four times a day (QID) | RESPIRATORY_TRACT | Status: DC | PRN
  Administered 2023-07-06: 2.5 mg via RESPIRATORY_TRACT
  Filled 2023-07-05: qty 3

## 2023-07-05 MED ORDER — OXYBUTYNIN CHLORIDE ER 10 MG PO TB24
10.0000 mg | ORAL_TABLET | Freq: Every day | ORAL | Status: DC
  Administered 2023-07-05 – 2023-07-09 (×6): 10 mg via ORAL
  Filled 2023-07-05 (×8): qty 1

## 2023-07-05 MED ORDER — DOCUSATE SODIUM 100 MG PO CAPS: 100.0000 mg | ORAL_CAPSULE | Freq: Two times a day (BID) | ORAL | Status: DC | PRN

## 2023-07-05 MED ORDER — LACTATED RINGERS IV SOLN: INTRAVENOUS | Status: AC

## 2023-07-05 NOTE — Progress Notes (Signed)
 PHARMACY - ANTICOAGULATION CONSULT NOTE  Pharmacy Consult for heparin  Indication: pulmonary embolus  No Known Allergies  Patient Measurements: Height: 5' (152.4 cm) Weight: 90.3 kg (199 lb 1.2 oz) IBW/kg (Calculated) : 45.5 Heparin  Dosing Weight: 70kg  Vital Signs: Temp: 98 F (36.7 C) (01/05 0020) Temp Source: Oral (01/05 0020) BP: 155/94 (01/05 0145) Pulse Rate: 102 (01/05 0145)  Labs: Recent Labs    07/04/23 1937 07/04/23 2022 07/05/23 0125  HGB 13.1  --  13.1  HCT 40.4  --  40.2  PLT 131*  --  114*  APTT  --  26 50*  LABPROT  --  14.0 16.6*  INR  --  1.1 1.3*  CREATININE 1.57*  --   --   TROPONINIHS 174* 201*  --     Estimated Creatinine Clearance: 36.7 mL/min (A) (by C-G formula based on SCr of 1.57 mg/dL (H)).   Medical History: No past medical history on file.  Medications:  Medications Prior to Admission  Medication Sig Dispense Refill Last Dose/Taking   albuterol  (PROVENTIL ) (2.5 MG/3ML) 0.083% nebulizer solution       BREO ELLIPTA 200-25 MCG/INH AEPB       COVID-19 At Home Antigen Test KIT Use as directed. 2 each 0    fluticasone  (FLONASE ) 50 MCG/ACT nasal spray Place 2 sprays into both nostrils daily. 16 g 0    hydrALAZINE  (APRESOLINE ) 25 MG tablet TK 1 T PO TID WF      lisinopril  (PRINIVIL ,ZESTRIL ) 40 MG tablet       nirmatrelvir /ritonavir  (PAXLOVID ) 20 x 150 MG & 10 x 100MG  TABS Take 3 tablets by mouth 2 (two) times daily for 5 days. (Take nirmatrelvir  150 mg two tablets twice daily for 5 days and ritonavir  100 mg one tablet twice daily for 5 days) 30 tablet 0    oxybutynin  (DITROPAN -XL) 10 MG 24 hr tablet       promethazine -dextromethorphan (PROMETHAZINE -DM) 6.25-15 MG/5ML syrup Take 5 mLs by mouth 4 (four) times daily as needed. 118 mL 0    Semaglutide -Weight Management (WEGOVY ) 1 MG/0.5ML SOAJ Inject 1 mg into the skin once a week. 2 mL 1    Semaglutide -Weight Management (WEGOVY ) 1.7 MG/0.75ML SOAJ Inject 1.7 mg into the skin once a week. 3 mL 0     Semaglutide -Weight Management (WEGOVY ) 2.4 MG/0.75ML SOAJ Inject 2.4 mg into the skin once a week. 3 mL 2    Semaglutide -Weight Management (WEGOVY ) 2.4 MG/0.75ML SOAJ Inject 2.4 mg into the skin every 7 (seven) days. 2 mL 2    triamterene -hydrochlorothiazide  (MAXZIDE -25) 37.5-25 MG tablet Take 1 tablet by mouth daily.      Scheduled:   Chlorhexidine  Gluconate Cloth  6 each Topical Q0600   fluticasone   2 spray Each Nare Daily   insulin  aspart  0-15 Units Subcutaneous TID WC   oxybutynin   10 mg Oral QHS   Infusions:   lactated ringers  75 mL/hr at 07/05/23 0051    Assessment: 62yo female c/o worsening SOB despite antiviral tx for Covid, EMS reports O2 89-90% on RA and was at 93-94% on 4L, found at [facility] ED to have BLL PE w/ RHS >> tPA was given and pt tx'd to [facility] MICU for further treatment; PTT now 50 >> will resume systemic heparin .  Goal of Therapy:  Heparin  level 0.3-0.5 until 1/6 0100 then 0.3-0.7 units/ml Monitor platelets by anticoagulation protocol: Yes   Plan:  Start heparin  infusion at 900 units/hr. Monitor heparin  levels and CBC.  Marvetta Dauphin, PharmD, BCPS  07/05/2023,2:04 AM

## 2023-07-05 NOTE — Plan of Care (Deleted)
  Problem: Education: Goal: Knowledge of risk factors and measures for prevention of condition will improve Outcome: Progressing   Problem: Coping: Goal: Psychosocial and spiritual needs will be supported Outcome: Progressing   Problem: Respiratory: Goal: Will maintain a patent airway Outcome: Progressing   Problem: Respiratory: Goal: Complications related to the disease process, condition or treatment will be avoided or minimized Outcome: Progressing   Problem: Education: Goal: Knowledge of General Education information will improve Description: Including pain rating scale, medication(s)/side effects and non-pharmacologic comfort measures Outcome: Progressing

## 2023-07-05 NOTE — Progress Notes (Signed)
 Pt transitioned off heated high flow to a salter high flow nasal cannula without complication. Pt on 12L and comfortable at this time.

## 2023-07-05 NOTE — Progress Notes (Signed)
 NAME:  Dana Donovan, MRN:  [id number], DOB:  29-Mar-1961, LOS: 1 ADMISSION DATE:  07/04/2023, CONSULTATION DATE:  1/4 REFERRING MD:  Angelena, CHIEF COMPLAINT:  submassive PE    History of Present Illness:  63 year old female w/ h/o obesity, HTN, OSA,  Recent (+) COVID (Jun 30 2023). Treated w/ paxlovid  and Phenergan  for nausea. Brought in to ER on   w/ cc: increased shortness of breath, fatigue and then a syncopal episode on Jan 4 while sitting up in chair. EMS arrival sats 89-90% room air. Initial HR 140s, BP 133/88, sats 94% on 4 lpm.   Initial lab findings: d dimer > 20,  Na 131, CO2 20, cr 1.57, Trop I 174 up to  201, lactate > 4, BNP > 2000 CT angio saddle PE w/ large clot burden and RV/LV ration of 3 PESI score: 103  dexamethasone at a dose of 6 mg daily for 10 days  Pertinent  Medical History  Obesity (on Wegovy ), HTN, OSA Significant Hospital Events: Including procedures, antibiotic start and stop dates in addition to other pertinent events   Admitted s/p recent dx COVID 12/31. Treated Paxlovid . Comes in w/ submassive PE s/p syncopal episode. 1/2 dose Alteplase  administered in ER.  1/5 weaning O2. Hep gtt   Interim History / Subjective:  Feels better following lytics  HDS. Still on 35% at 35LPM but sats 98-100  Objective   Blood pressure (!) 151/92, pulse 99, temperature 97.9 F (36.6 C), temperature source Oral, resp. rate (!) 23, height 5' (1.524 m), weight 90.3 kg, SpO2 98%.    FiO2 (%):  [35 %] 35 %   Intake/Output Summary (Last 24 hours) at 07/05/2023 1052 Last data filed at 07/05/2023 0900 Gross per 24 hour  Intake 726.17 ml  Output --  Net 726.17 ml   Filed Weights   07/05/23 0200  Weight: 90.3 kg    Examination: General: wdwn middle aged F Nad  HENT: NCAT pink mm HHFNC in place  Lungs: CTAb  Cardiovascular: rrr s1s2 cap refill brisk  Abdomen: soft ndnt  Extremities: no acute joint deformity  Neuro: AAOx4 no focal def  GU: external cath   Resolved  Hospital Problem list     Assessment & Plan:   Acute hypoxic resp failure  Massive PE s/p 1/2 dose lytics -- provoked 2/2 COVID infection COVID infection (dx 12/31) s/p paxlovid  -PESI score intermediate risk at 103, but had + trops, RV/LV ratio of 3 and syncopal episode. No vascular surg coverage for thrombectomy.  Given presentation and risk,  dose TPA Alteplase  administered.  P -on hep gtt now following 1/2 dose lytics -will need transition to DOAC -- anticipate 28mo anticoagulation -lower extremity US  pending -wean O2 -cont cardiac monitoring -re covid -- at this point don't think measures like steroids would be helpful  -covid iso   AKI, improving   NAGMA Hyponatremia  P -trend renal indices, UOP -cont IVF  -starting to take POs, hopefully advance over course of day    Lactic acidosis, improving -2/2 PE +/- AKI  P -follow PRN   Hyperglycemia P -SSI   Hx HTN  P -antihypertensives have been held, pending hemodynamics over the course of the day may start back    Best Practice (right click and Reselect all SmartList Selections daily)   Diet/type: clear liquids -- hopefully advance  DVT prophylaxis systemic heparin  Pressure ulcer(s): N/A GI prophylaxis: N/A Lines: N/A Foley:  N/A Code Status:  full code Last date of multidisciplinary  goals of care discussion [full code]  Labs   CBC: Recent Labs  Lab 07/04/23 1937 07/05/23 0125  WBC 13.0* 14.9*  HGB 13.1 13.1  HCT 40.4 40.2  MCV 85.6 84.3  PLT 131* 114*    Basic Metabolic Panel: Recent Labs  Lab 07/04/23 1937 07/05/23 0416  NA 131* 136  K 3.6 3.8  CL 96* 102  CO2 20* 19*  GLUCOSE 235* 114*  BUN 30* 28*  CREATININE 1.57* 1.33*  CALCIUM 8.7* 8.4*   GFR: Estimated Creatinine Clearance: 43.3 mL/min (A) (by C-G formula based on SCr of 1.33 mg/dL (H)). Recent Labs  Lab 07/04/23 1937 07/04/23 2022 07/05/23 0125 07/05/23 0416  PROCALCITON  --  <0.10  --   --   WBC 13.0*  --  14.9*  --    LATICACIDVEN  --  4.2* 4.3* 3.5*    Liver Function Tests: Recent Labs  Lab 07/04/23 2022  AST 29  ALT 24  ALKPHOS 78  BILITOT 0.6  PROT 7.6  ALBUMIN 3.6   No results for input(s): LIPASE, AMYLASE in the last 168 hours. No results for input(s): AMMONIA in the last 168 hours.  ABG No results found for: PHART, PCO2ART, PO2ART, HCO3, TCO2, ACIDBASEDEF, O2SAT   Coagulation Profile: Recent Labs  Lab 07/04/23 2022 07/05/23 0125  INR 1.1 1.3*    Cardiac Enzymes: No results for input(s): CKTOTAL, CKMB, CKMBINDEX, TROPONINI in the last 168 hours.  HbA1C: No results found for: HGBA1C  CBG: Recent Labs  Lab 07/05/23 0032 07/05/23 0705  GLUCAP 147* 129*    CRITICAL CARE Performed by: Ronnald FORBES Gave  Total critical care time: 37 minutes   Critical care time was exclusive of separately billable procedures and treating other patients. Critical care was necessary to treat or prevent imminent or life-threatening deterioration.  Critical care was time spent personally by me on the following activities: development of treatment plan with patient and/or surrogate as well as nursing, discussions with consultants, evaluation of patient's response to treatment, examination of patient, obtaining history from patient or surrogate, ordering and performing treatments and interventions, ordering and review of laboratory studies, ordering and review of radiographic studies, pulse oximetry and re-evaluation of patient's condition.  Ronnald Gave MSN, AGACNP-BC [facility]/Critical [facility] Medicine Amion for pager  07/05/2023, 10:52 AM

## 2023-07-05 NOTE — Progress Notes (Signed)
 O2 weaning throughout the day. Has remained HDS  D/w attending, will transfer out of ICU to progressive this afternoon Will ask [facility] to take over care 1/6   Tessie Fass MSN, AGACNP-BC [facility] Pulmonary/Critical Care Medicine 07/05/2023, 2:43 PM

## 2023-07-05 NOTE — Progress Notes (Signed)
 PHARMACY - ANTICOAGULATION CONSULT NOTE  Pharmacy Consult for heparin  Indication: pulmonary embolus  No Known Allergies  Patient Measurements: Height: 5' (152.4 cm) Weight: 90.3 kg (199 lb 1.2 oz) IBW/kg (Calculated) : 45.5 Heparin  Dosing Weight: 70kg  Vital Signs: Temp: 98.5 F (36.9 C) (01/05 1507) Temp Source: Oral (01/05 1854) BP: 136/74 (01/05 1700) Pulse Rate: 92 (01/05 1700)  Labs: Recent Labs    07/04/23 1937 07/04/23 2022 07/05/23 0125 07/05/23 0416 07/05/23 1025 07/05/23 1820  HGB 13.1  --  13.1  --   --   --   HCT 40.4  --  40.2  --   --   --   PLT 131*  --  114*  --   --   --   APTT  --  26 50*  --   --   --   LABPROT  --  14.0 16.6*  --   --   --   INR  --  1.1 1.3*  --   --   --   HEPARINUNFRC  --   --   --   --  0.53 0.41  CREATININE 1.57*  --   --  1.33*  --   --   TROPONINIHS 174* 201*  --   --   --   --     Estimated Creatinine Clearance: 43.3 mL/min (A) (by C-G formula based on SCr of 1.33 mg/dL (H)).   Medical History: No past medical history on file.  Medications:  Medications Prior to Admission  Medication Sig Dispense Refill Last Dose/Taking   albuterol  (PROVENTIL ) (2.5 MG/3ML) 0.083% nebulizer solution Take 2.5 mg by nebulization every 6 (six) hours as needed for shortness of breath or wheezing.   Past Week   albuterol  (VENTOLIN  HFA) 108 (90 Base) MCG/ACT inhaler Inhale 2 puffs into the lungs every 6 (six) hours as needed for wheezing or shortness of breath.   Past Week   diclofenac (VOLTAREN) 75 MG EC tablet Take 75 mg by mouth 2 (two) times daily as needed for mild pain (pain score 1-3).   Taking As Needed   DRY EYE RELIEF DROPS 0.2-0.2-1 % SOLN Place 1 drop into both eyes in the morning, at noon, and at bedtime.   07/04/2023   fluticasone  (FLONASE ) 50 MCG/ACT nasal spray Place 2 sprays into both nostrils daily. (Patient taking differently: Place 2 sprays into both nostrils daily as needed for allergies or rhinitis.) 16 g 0 Taking  Differently   latanoprost (XALATAN) 0.005 % ophthalmic solution Place 1 drop into both eyes at bedtime.   07/03/2023   lisinopril  (PRINIVIL ,ZESTRIL ) 40 MG tablet Take 40 mg by mouth daily.   06/30/2023   nirmatrelvir /ritonavir  (PAXLOVID ) 20 x 150 MG & 10 x 100MG  TABS Take 3 tablets by mouth 2 (two) times daily for 5 days. (Take nirmatrelvir  150 mg two tablets twice daily for 5 days and ritonavir  100 mg one tablet twice daily for 5 days) 30 tablet 0 Past Week   oxybutynin  (DITROPAN -XL) 10 MG 24 hr tablet Take 10 mg by mouth at bedtime.   06/30/2023   PRESCRIPTION MEDICATION See admin instructions. CPAP- At bedtime   07/03/2023   promethazine -dextromethorphan (PROMETHAZINE -DM) 6.25-15 MG/5ML syrup Take 5 mLs by mouth 4 (four) times daily as needed. (Patient taking differently: Take 5 mLs by mouth 4 (four) times daily as needed for cough.) 118 mL 0 Past Week   Semaglutide -Weight Management (WEGOVY ) 2.4 MG/0.75ML SOAJ Inject 2.4 mg into the skin once a week. (Patient  taking differently: Inject 2.4 mg into the skin every Thursday.) 3 mL 2 06/25/2023   triamterene -hydrochlorothiazide  (MAXZIDE ) 75-50 MG tablet Take 1 tablet by mouth daily.   06/30/2023   TYLENOL  500 MG tablet Take 500-1,000 mg by mouth every 6 (six) hours as needed for mild pain (pain score 1-3), headache or fever.   Past Week   COVID-19 At Home Antigen Test KIT Use as directed. (Patient not taking: Reported on 07/05/2023) 2 each 0 Not Taking   Semaglutide -Weight Management (WEGOVY ) 1 MG/0.5ML SOAJ Inject 1 mg into the skin once a week. (Patient not taking: Reported on 07/05/2023) 2 mL 1 Not Taking   Semaglutide -Weight Management (WEGOVY ) 1.7 MG/0.75ML SOAJ Inject 1.7 mg into the skin once a week. (Patient not taking: Reported on 07/05/2023) 3 mL 0 Not Taking   Semaglutide -Weight Management (WEGOVY ) 2.4 MG/0.75ML SOAJ Inject 2.4 mg into the skin every 7 (seven) days. (Patient not taking: Reported on 07/05/2023) 2 mL 2 Not Taking   Scheduled:    Chlorhexidine  Gluconate Cloth  6 each Topical Q0600   fluticasone   2 spray Each Nare Daily   insulin  aspart  0-15 Units Subcutaneous TID WC   oxybutynin   10 mg Oral QHS   Infusions:   heparin  850 Units/hr (07/05/23 1224)   lactated ringers  75 mL/hr at 07/05/23 0900    Assessment: 63yo female c/o worsening SOB despite antiviral tx for Covid, EMS reports O2 89-90% on RA and was at 93-94% on 4L, found at [facility] ED to have BLL PE w/ RHS >> tPA was given and pt tx'd to [facility] MICU for further treatment; resumed systemic heparin  with aPTT 50.  Pt is therapeutically anticoagulated at 0.53 however slightly supratherapeutic in relation to lower HL goal for bleed risk. No new c/f bleeding or issues with line at this time.   PM: heparin  level 0.41 is therapeutic (current goal 0.3-0.5 until 0100 tomorrow). No issues with the heparin  infusion or bleeding documented.  Goal of Therapy:  Heparin  level 0.3-0.5 until 1/6 0100 then 0.3-0.7 units/ml Monitor platelets by anticoagulation protocol: Yes   Plan:  Continue heparin  infusion to 850 units/h Monitor heparin  levels and CBC daily and PRN Transition to oral anticoagulation as able  Rocky Slade, PharmD, BCPS 07/05/2023,6:59 PM  Please check AMION for all [facility] Pharmacy phone numbers After 10:00 PM, call Main Pharmacy [phone]

## 2023-07-05 NOTE — Progress Notes (Signed)
 PHARMACY - ANTICOAGULATION CONSULT NOTE  Pharmacy Consult for heparin  Indication: pulmonary embolus  No Known Allergies  Patient Measurements: Height: 5' (152.4 cm) Weight: 90.3 kg (199 lb 1.2 oz) IBW/kg (Calculated) : 45.5 Heparin  Dosing Weight: 70kg  Vital Signs: Temp: 97.9 F (36.6 C) (01/05 0756) Temp Source: Oral (01/05 0756) BP: 151/92 (01/05 0900) Pulse Rate: 99 (01/05 0900)  Labs: Recent Labs    07/04/23 1937 07/04/23 2022 07/05/23 0125 07/05/23 0416 07/05/23 1025  HGB 13.1  --  13.1  --   --   HCT 40.4  --  40.2  --   --   PLT 131*  --  114*  --   --   APTT  --  26 50*  --   --   LABPROT  --  14.0 16.6*  --   --   INR  --  1.1 1.3*  --   --   HEPARINUNFRC  --   --   --   --  0.53  CREATININE 1.57*  --   --  1.33*  --   TROPONINIHS 174* 201*  --   --   --     Estimated Creatinine Clearance: 43.3 mL/min (A) (by C-G formula based on SCr of 1.33 mg/dL (H)).   Medical History: No past medical history on file.  Medications:  Medications Prior to Admission  Medication Sig Dispense Refill Last Dose/Taking   albuterol  (PROVENTIL ) (2.5 MG/3ML) 0.083% nebulizer solution       BREO ELLIPTA 200-25 MCG/INH AEPB       COVID-19 At Home Antigen Test KIT Use as directed. 2 each 0    fluticasone  (FLONASE ) 50 MCG/ACT nasal spray Place 2 sprays into both nostrils daily. 16 g 0    hydrALAZINE  (APRESOLINE ) 25 MG tablet TK 1 T PO TID WF      lisinopril  (PRINIVIL ,ZESTRIL ) 40 MG tablet       nirmatrelvir /ritonavir  (PAXLOVID ) 20 x 150 MG & 10 x 100MG  TABS Take 3 tablets by mouth 2 (two) times daily for 5 days. (Take nirmatrelvir  150 mg two tablets twice daily for 5 days and ritonavir  100 mg one tablet twice daily for 5 days) 30 tablet 0    oxybutynin  (DITROPAN -XL) 10 MG 24 hr tablet       promethazine -dextromethorphan (PROMETHAZINE -DM) 6.25-15 MG/5ML syrup Take 5 mLs by mouth 4 (four) times daily as needed. 118 mL 0    Semaglutide -Weight Management (WEGOVY ) 1 MG/0.5ML SOAJ  Inject 1 mg into the skin once a week. 2 mL 1    Semaglutide -Weight Management (WEGOVY ) 1.7 MG/0.75ML SOAJ Inject 1.7 mg into the skin once a week. 3 mL 0    Semaglutide -Weight Management (WEGOVY ) 2.4 MG/0.75ML SOAJ Inject 2.4 mg into the skin once a week. 3 mL 2    Semaglutide -Weight Management (WEGOVY ) 2.4 MG/0.75ML SOAJ Inject 2.4 mg into the skin every 7 (seven) days. 2 mL 2    triamterene -hydrochlorothiazide  (MAXZIDE -25) 37.5-25 MG tablet Take 1 tablet by mouth daily.      Scheduled:   Chlorhexidine  Gluconate Cloth  6 each Topical Q0600   fluticasone   2 spray Each Nare Daily   insulin  aspart  0-15 Units Subcutaneous TID WC   oxybutynin   10 mg Oral QHS   Infusions:   heparin  900 Units/hr (07/05/23 0900)   lactated ringers  75 mL/hr at 07/05/23 0900    Assessment: 62yo female c/o worsening SOB despite antiviral tx for Covid, EMS reports O2 89-90% on RA and was at 93-94% on 4L,  found at [facility] ED to have BLL PE w/ RHS >> tPA was given and pt tx'd to [facility] MICU for further treatment; resumed systemic heparin  with aPTT 50.  Pt is therapeutically anticoagulated at 0.53 however slightly supratherapeutic in relation to lower HL goal for bleed risk. No new c/f bleeding or issues with line at this time.   Goal of Therapy:  Heparin  level 0.3-0.5 until 1/6 0100 then 0.3-0.7 units/ml Monitor platelets by anticoagulation protocol: Yes   Plan:  Decrease heparin  infusion to 850 units/h Monitor heparin  levels and CBC daily and PRN Transition to oral anticoagulation as able  Randall Call, PharmD PGY2 Critical Care Pharmacy Resident 07/05/2023,11:18 AM

## 2023-07-06 ENCOUNTER — Inpatient Hospital Stay (HOSPITAL_COMMUNITY): Payer: Managed Care, Other (non HMO)

## 2023-07-06 ENCOUNTER — Encounter (HOSPITAL_COMMUNITY): Payer: Self-pay | Admitting: Pulmonary Disease

## 2023-07-06 ENCOUNTER — Telehealth (HOSPITAL_COMMUNITY): Payer: Self-pay | Admitting: Pharmacy Technician

## 2023-07-06 ENCOUNTER — Other Ambulatory Visit (HOSPITAL_COMMUNITY): Payer: Self-pay

## 2023-07-06 DIAGNOSIS — U071 COVID-19: Secondary | ICD-10-CM

## 2023-07-06 DIAGNOSIS — I2699 Other pulmonary embolism without acute cor pulmonale: Secondary | ICD-10-CM | POA: Diagnosis not present

## 2023-07-06 DIAGNOSIS — I2609 Other pulmonary embolism with acute cor pulmonale: Secondary | ICD-10-CM | POA: Diagnosis not present

## 2023-07-06 DIAGNOSIS — R609 Edema, unspecified: Secondary | ICD-10-CM | POA: Diagnosis not present

## 2023-07-06 LAB — CBC
HCT: 33.2 % — ABNORMAL LOW (ref 36.0–46.0)
Hemoglobin: 10.8 g/dL — ABNORMAL LOW (ref 12.0–15.0)
MCH: 27.3 pg (ref 26.0–34.0)
MCHC: 32.5 g/dL (ref 30.0–36.0)
MCV: 83.8 fL (ref 80.0–100.0)
Platelets: 153 10*3/uL (ref 150–400)
RBC: 3.96 MIL/uL (ref 3.87–5.11)
RDW: 14.1 % (ref 11.5–15.5)
WBC: 13.4 10*3/uL — ABNORMAL HIGH (ref 4.0–10.5)
nRBC: 0 % (ref 0.0–0.2)

## 2023-07-06 LAB — HEMOGLOBIN A1C
Hgb A1c MFr Bld: 5.9 % — ABNORMAL HIGH (ref 4.8–5.6)
Mean Plasma Glucose: 123 mg/dL

## 2023-07-06 LAB — ECHOCARDIOGRAM COMPLETE
AR max vel: 1.77 cm2
AV Peak grad: 9.9 mm[Hg]
Ao pk vel: 1.57 m/s
Area-P 1/2: 5.31 cm2
Est EF: >75
Height: 60 in
S' Lateral: 1.5 cm
Weight: 3252.23 [oz_av]

## 2023-07-06 LAB — GLUCOSE, CAPILLARY
Glucose-Capillary: 138 mg/dL — ABNORMAL HIGH (ref 70–99)
Glucose-Capillary: 109 mg/dL — ABNORMAL HIGH (ref 70–99)
Glucose-Capillary: 87 mg/dL (ref 70–99)
Glucose-Capillary: 80 mg/dL (ref 70–99)

## 2023-07-06 LAB — HEPARIN LEVEL (UNFRACTIONATED): Heparin Unfractionated: 0.39 [IU]/mL (ref 0.30–0.70)

## 2023-07-06 LAB — FERRITIN: Ferritin: 214 ng/mL (ref 11–307)

## 2023-07-06 LAB — C-REACTIVE PROTEIN: CRP: 4.1 mg/dL — ABNORMAL HIGH (ref <1.0)

## 2023-07-06 MED ORDER — POTASSIUM CHLORIDE CRYS ER 20 MEQ PO TBCR
40.0000 meq | EXTENDED_RELEASE_TABLET | Freq: Once | ORAL | Status: AC
  Administered 2023-07-06: 40 meq via ORAL
  Filled 2023-07-06: qty 2

## 2023-07-06 MED ORDER — PERFLUTREN LIPID MICROSPHERE
1.0000 mL | INTRAVENOUS | Status: AC | PRN
  Administered 2023-07-06: 3 mL via INTRAVENOUS

## 2023-07-06 MED ORDER — FUROSEMIDE 10 MG/ML IJ SOLN
20.0000 mg | Freq: Once | INTRAMUSCULAR | Status: AC
  Administered 2023-07-06: 20 mg via INTRAVENOUS
  Filled 2023-07-06: qty 2

## 2023-07-06 NOTE — Progress Notes (Signed)
 Echocardiogram 2D Echocardiogram has been performed.  Lucendia Herrlich 07/06/2023, 2:48 PM

## 2023-07-06 NOTE — Progress Notes (Signed)
 PHARMACY - ANTICOAGULATION CONSULT NOTE  Pharmacy Consult for heparin  Indication: pulmonary embolus  No Known Allergies  Patient Measurements: Height: 5' (152.4 cm) Weight: 92.2 kg (203 lb 4.2 oz) IBW/kg (Calculated) : 45.5 Heparin  Dosing Weight: 70kg  Vital Signs: Temp: 98.2 F (36.8 C) (01/06 0729) Temp Source: Oral (01/06 0729) BP: 151/91 (01/06 0729) Pulse Rate: 83 (01/06 0729)  Labs: Recent Labs    07/04/23 1937 07/04/23 2022 07/05/23 0125 07/05/23 0416 07/05/23 1025 07/05/23 1820 07/06/23 0212  HGB 13.1  --  13.1  --   --   --  10.8*  HCT 40.4  --  40.2  --   --   --  33.2*  PLT 131*  --  114*  --   --   --  153  APTT  --  26 50*  --   --   --   --   LABPROT  --  14.0 16.6*  --   --   --   --   INR  --  1.1 1.3*  --   --   --   --   HEPARINUNFRC  --   --   --   --  0.53 0.41 0.39  CREATININE 1.57*  --   --  1.33*  --   --   --   TROPONINIHS 174* 201*  --   --   --   --   --     Estimated Creatinine Clearance (by C-G formula based on SCr of 1.33 mg/dL (H)) Female: 56.0 mL/min (A) Female: 53.8 mL/min (A)   Medical History: History reviewed. No pertinent past medical history.  Medications:  Medications Prior to Admission  Medication Sig Dispense Refill Last Dose/Taking   albuterol  (PROVENTIL ) (2.5 MG/3ML) 0.083% nebulizer solution Take 2.5 mg by nebulization every 6 (six) hours as needed for shortness of breath or wheezing.   Past Week   albuterol  (VENTOLIN  HFA) 108 (90 Base) MCG/ACT inhaler Inhale 2 puffs into the lungs every 6 (six) hours as needed for wheezing or shortness of breath.   Past Week   diclofenac (VOLTAREN) 75 MG EC tablet Take 75 mg by mouth 2 (two) times daily as needed for mild pain (pain score 1-3).   Taking As Needed   DRY EYE RELIEF DROPS 0.2-0.2-1 % SOLN Place 1 drop into both eyes in the morning, at noon, and at bedtime.   07/04/2023   fluticasone  (FLONASE ) 50 MCG/ACT nasal spray Place 2 sprays into both nostrils daily. (Patient taking  differently: Place 2 sprays into both nostrils daily as needed for allergies or rhinitis.) 16 g 0 Taking Differently   latanoprost (XALATAN) 0.005 % ophthalmic solution Place 1 drop into both eyes at bedtime.   07/03/2023   lisinopril  (PRINIVIL ,ZESTRIL ) 40 MG tablet Take 40 mg by mouth daily.   06/30/2023   [EXPIRED] nirmatrelvir /ritonavir  (PAXLOVID ) 20 x 150 MG & 10 x 100MG  TABS Take 3 tablets by mouth 2 (two) times daily for 5 days. (Take nirmatrelvir  150 mg two tablets twice daily for 5 days and ritonavir  100 mg one tablet twice daily for 5 days) 30 tablet 0 Past Week   oxybutynin  (DITROPAN -XL) 10 MG 24 hr tablet Take 10 mg by mouth at bedtime.   06/30/2023   PRESCRIPTION MEDICATION See admin instructions. CPAP- At bedtime   07/03/2023   promethazine -dextromethorphan (PROMETHAZINE -DM) 6.25-15 MG/5ML syrup Take 5 mLs by mouth 4 (four) times daily as needed. (Patient taking differently: Take 5 mLs by mouth 4 (four) times  daily as needed for cough.) 118 mL 0 Past Week   Semaglutide -Weight Management (WEGOVY ) 2.4 MG/0.75ML SOAJ Inject 2.4 mg into the skin once a week. (Patient taking differently: Inject 2.4 mg into the skin every Thursday.) 3 mL 2 06/25/2023   triamterene -hydrochlorothiazide  (MAXZIDE ) 75-50 MG tablet Take 1 tablet by mouth daily.   06/30/2023   TYLENOL  500 MG tablet Take 500-1,000 mg by mouth every 6 (six) hours as needed for mild pain (pain score 1-3), headache or fever.   Past Week   COVID-19 At Home Antigen Test KIT Use as directed. (Patient not taking: Reported on 07/05/2023) 2 each 0 Not Taking   Semaglutide -Weight Management (WEGOVY ) 1 MG/0.5ML SOAJ Inject 1 mg into the skin once a week. (Patient not taking: Reported on 07/05/2023) 2 mL 1 Not Taking   Semaglutide -Weight Management (WEGOVY ) 1.7 MG/0.75ML SOAJ Inject 1.7 mg into the skin once a week. (Patient not taking: Reported on 07/05/2023) 3 mL 0 Not Taking   Semaglutide -Weight Management (WEGOVY ) 2.4 MG/0.75ML SOAJ Inject 2.4 mg into the  skin every 7 (seven) days. (Patient not taking: Reported on 07/05/2023) 2 mL 2 Not Taking   Scheduled:   fluticasone   2 spray Each Nare Daily   insulin  aspart  0-15 Units Subcutaneous TID WC   oxybutynin   10 mg Oral QHS   Infusions:   heparin  850 Units/hr (07/06/23 0734)    Assessment: 62yo female c/o worsening SOB despite antiviral tx for Covid, EMS reports O2 89-90% on RA and was at 93-94% on 4L, found at [facility] ED to have BLL PE w/ RHS >> tPA was given and pt tx'd to [facility] MICU for further treatment and now on IV heparin    Goal of Therapy:  Heparin  level  0.3-0.7 units/ml Monitor platelets by anticoagulation protocol: Yes   Plan:  -Continue heparin  at 850 units/hr -Daily heparin  level and CBC -Will follow oral anticoagulation plans  Prentice Poisson, PharmD Clinical Pharmacist **Pharmacist phone directory can now be found on [email or web address] ([email or web address]).  Listed under [facility].

## 2023-07-06 NOTE — Plan of Care (Signed)
  Problem: Education: Goal: Knowledge of risk factors and measures for prevention of condition will improve Outcome: Progressing   Problem: Coping: Goal: Psychosocial and spiritual needs will be supported Outcome: Progressing   Problem: Respiratory: Goal: Will maintain a patent airway Outcome: Progressing Goal: Complications related to the disease process, condition or treatment will be avoided or minimized Outcome: Progressing   Problem: Education: Goal: Knowledge of General Education information will improve Description: Including pain rating scale, medication(s)/side effects and non-pharmacologic comfort measures Outcome: Progressing   Problem: Health Behavior/Discharge Planning: Goal: Ability to manage health-related needs will improve Outcome: Progressing   Problem: Clinical Measurements: Goal: Ability to maintain clinical measurements within normal limits will improve Outcome: Progressing Goal: Will remain free from infection Outcome: Progressing Goal: Diagnostic test results will improve Outcome: Progressing Goal: Respiratory complications will improve Outcome: Progressing Goal: Cardiovascular complication will be avoided Outcome: Progressing   Problem: Activity: Goal: Risk for activity intolerance will decrease Outcome: Progressing   Problem: Nutrition: Goal: Adequate nutrition will be maintained Outcome: Progressing   Problem: Coping: Goal: Level of anxiety will decrease Outcome: Progressing   Problem: Elimination: Goal: Will not experience complications related to bowel motility Outcome: Progressing Goal: Will not experience complications related to urinary retention Outcome: Progressing   Problem: Pain Management: Goal: General experience of comfort will improve Outcome: Progressing   Problem: Safety: Goal: Ability to remain free from injury will improve Outcome: Progressing   Problem: Skin Integrity: Goal: Risk for impaired skin integrity will  decrease Outcome: Progressing   Problem: Education: Goal: Ability to describe self-care measures that may prevent or decrease complications (Diabetes Survival Skills Education) will improve Outcome: Progressing Goal: Individualized Educational Video(s) Outcome: Progressing   Problem: Coping: Goal: Ability to adjust to condition or change in health will improve Outcome: Progressing   Problem: Fluid Volume: Goal: Ability to maintain a balanced intake and output will improve Outcome: Progressing   Problem: Health Behavior/Discharge Planning: Goal: Ability to identify and utilize available resources and services will improve Outcome: Progressing Goal: Ability to manage health-related needs will improve Outcome: Progressing   Problem: Metabolic: Goal: Ability to maintain appropriate glucose levels will improve Outcome: Progressing   Problem: Nutritional: Goal: Maintenance of adequate nutrition will improve Outcome: Progressing Goal: Progress toward achieving an optimal weight will improve Outcome: Progressing   Problem: Skin Integrity: Goal: Risk for impaired skin integrity will decrease Outcome: Progressing   Problem: Tissue Perfusion: Goal: Adequacy of tissue perfusion will improve Outcome: Progressing

## 2023-07-06 NOTE — Progress Notes (Signed)
 Patient O2 weaned to 3L [place] O2 Sats 99%.

## 2023-07-06 NOTE — Progress Notes (Addendum)
 PROGRESS NOTE    Dana Donovan  FMW:[id number] DOB: 04/28/1961 DOA: 07/04/2023 PCP: Elliot Charm, MD  62/F with obesity, hypertension, OSA was recently diagnosed with COVID on 12/31, treated with Paxlovid  and Phenergan , presented to the ED 1/4 with syncope shortness of breath and fatigue.  In the ED D-dimer was> 20, troponin 174, 201, lactate> 4, CTA with saddle pulmonary embolism and large bilateral PE, with high clot burden. -Admitted to the ICU, treated with half dose alteplase  and started on heparin  drip TX from PCCM to TRH service today 1/6   Subjective: -Feels better over all, breathing is improving, down to 2 to 3 L O2  Assessment and Plan:  Acute hypoxic respiratory failure Acute saddle PE, massive pulmonary emboli -Likely provoked by COVID infection, obesity -Has also been on Wegovy  for 3 months -Treated with half dose tPA on admission -Now on heparin  drip, transition to oral DOAC tomorrow if stable -Follow-up 2D echo -Low-dose Lasix  X1  Recent COVID 12/31 -With PE as above -No evidence of COVID-pneumonia on CTA, check CRP and ferritin  AKI Hyponatremia -Improving  Obesity -BMI is 39, started on Wegovy  3 months ago  Hyperglycemia -SSI, check HbA1c  History of hypertension -Antihypertensives on hold      DVT prophylaxis: IV heparin  Code Status: Full code Family Communication: Spouse at bedside Disposition Plan: Home like 2 to 3 days  Consultants:    Procedures:   Antimicrobials:    Objective: Vitals:   07/05/23 2303 07/06/23 0226 07/06/23 0331 07/06/23 0729  BP: 139/80  (!) 128/94 (!) 151/91  Pulse: 61  70 83  Resp: 16  17 15   Temp: 98 F (36.7 C)  98.2 F (36.8 C) 98.2 F (36.8 C)  TempSrc: Oral  Oral Oral  SpO2: 100% 100% 96% 99%  Weight:   92.2 kg   Height:        Intake/Output Summary (Last 24 hours) at 07/06/2023 1012 Last data filed at 07/05/2023 1800 Gross per 24 hour  Intake --  Output 400 ml  Net -400 ml    Filed Weights   07/05/23 0200 07/06/23 0331  Weight: 90.3 kg 92.2 kg    Examination:  General exam: Appears calm and comfortable  Respiratory system: Clear to auscultation Cardiovascular system: S1 & S2 heard, RRR.  Abd: nondistended, soft and nontender.Normal bowel sounds heard. Central nervous system: Alert and oriented. No focal neurological deficits. Extremities: no edema Skin: No rashes Psychiatry:  Mood & affect appropriate.     Data Reviewed:   CBC: Recent Labs  Lab 07/04/23 1937 07/05/23 0125 07/06/23 0212  WBC 13.0* 14.9* 13.4*  HGB 13.1 13.1 10.8*  HCT 40.4 40.2 33.2*  MCV 85.6 84.3 83.8  PLT 131* 114* 153   Basic Metabolic Panel: Recent Labs  Lab 07/04/23 1937 07/05/23 0416  NA 131* 136  K 3.6 3.8  CL 96* 102  CO2 20* 19*  GLUCOSE 235* 114*  BUN 30* 28*  CREATININE 1.57* 1.33*  CALCIUM 8.7* 8.4*   GFR: Estimated Creatinine Clearance (by C-G formula based on SCr of 1.33 mg/dL (H)) Female: 56.0 mL/min (A) Female: 53.8 mL/min (A) Liver Function Tests: Recent Labs  Lab 07/04/23 2022  AST 29  ALT 24  ALKPHOS 78  BILITOT 0.6  PROT 7.6  ALBUMIN 3.6   No results for input(s): LIPASE, AMYLASE in the last 168 hours. No results for input(s): AMMONIA in the last 168 hours. Coagulation Profile: Recent Labs  Lab 07/04/23 2022 07/05/23 0125  INR 1.1 1.3*  Cardiac Enzymes: No results for input(s): CKTOTAL, CKMB, CKMBINDEX, TROPONINI in the last 168 hours. BNP (last 3 results) No results for input(s): PROBNP in the last 8760 hours. HbA1C: No results for input(s): HGBA1C in the last 72 hours. CBG: Recent Labs  Lab 07/05/23 0705 07/05/23 1136 07/05/23 1506 07/05/23 2209 07/06/23 0621  GLUCAP 129* 143* 127* 127* 138*   Lipid Profile: No results for input(s): CHOL, HDL, LDLCALC, TRIG, CHOLHDL, LDLDIRECT in the last 72 hours. Thyroid Function Tests: No results for input(s): TSH, T4TOTAL, FREET4,  T3FREE, THYROIDAB in the last 72 hours. Anemia Panel: No results for input(s): VITAMINB12, FOLATE, FERRITIN, TIBC, IRON, RETICCTPCT in the last 72 hours. Urine analysis: No results found for: COLORURINE, APPEARANCEUR, LABSPEC, PHURINE, GLUCOSEU, HGBUR, BILIRUBINUR, KETONESUR, PROTEINUR, UROBILINOGEN, NITRITE, LEUKOCYTESUR Sepsis Labs: @LABRCNTIP (procalcitonin:4,lacticidven:4)  ) Recent Results (from the past 240 hours)  Blood culture (routine x 2)     Status: None (Preliminary result)   Collection Time: 07/04/23  8:22 PM   Specimen: BLOOD  Result Value Ref Range Status   Specimen Description BLOOD RIGHT AC  Final   Special Requests   Final    BOTTLES DRAWN AEROBIC AND ANAEROBIC Blood Culture results may not be optimal due to an inadequate volume of blood received in culture bottles   Culture   Final    NO GROWTH 2 DAYS Performed at [facility], [address] [place], [place] [address]    Report Status PENDING  Incomplete  Blood culture (routine x 2)     Status: None (Preliminary result)   Collection Time: 07/04/23  8:22 PM   Specimen: BLOOD  Result Value Ref Range Status   Specimen Description BLOOD RIGHT HAND  Final   Special Requests   Final    BOTTLES DRAWN AEROBIC AND ANAEROBIC Blood Culture results may not be optimal due to an inadequate volume of blood received in culture bottles   Culture   Final    NO GROWTH 2 DAYS Performed at [facility], [address], [place], [place] [address]    Report Status PENDING  Incomplete  Resp panel by RT-PCR (RSV, Flu A&B, Covid) Anterior Nasal Swab     Status: Abnormal   Collection Time: 07/04/23  8:22 PM   Specimen: Anterior Nasal Swab  Result Value Ref Range Status   SARS Coronavirus 2 by RT PCR POSITIVE (A) NEGATIVE Final    Comment: (NOTE) SARS-CoV-2 target nucleic acids are DETECTED.  The SARS-CoV-2 RNA is generally detectable in upper respiratory specimens during  the acute phase of infection. Positive results are indicative of the presence of the identified virus, but do not rule out bacterial infection or co-infection with other pathogens not detected by the test. Clinical correlation with patient history and other diagnostic information is necessary to determine patient infection status. The expected result is Negative.  Fact Sheet for Patients: bloggercourse.com  Fact Sheet for Healthcare Providers: seriousbroker.it  This test is not yet approved or cleared by the [place]  FDA and  has been authorized for detection and/or diagnosis of SARS-CoV-2 by FDA under an Emergency Use Authorization (EUA).  This EUA will remain in effect (meaning this test can be used) for the duration of  the COVID-19 declaration under Section 564(b)(1) of the A ct, 21 U.S.C. section 360bbb-3(b)(1), unless the authorization is terminated or revoked sooner.     Influenza A by PCR NEGATIVE NEGATIVE Final   Influenza B by PCR NEGATIVE NEGATIVE Final    Comment: (NOTE) The  Xpert Xpress SARS-CoV-2/FLU/RSV plus assay is intended as an aid in the diagnosis of influenza from Nasopharyngeal swab specimens and should not be used as a sole basis for treatment. Nasal washings and aspirates are unacceptable for Xpert Xpress SARS-CoV-2/FLU/RSV testing.  Fact Sheet for Patients: bloggercourse.com  Fact Sheet for Healthcare Providers: seriousbroker.it  This test is not yet approved or cleared by the [place]  FDA and has been authorized for detection and/or diagnosis of SARS-CoV-2 by FDA under an Emergency Use Authorization (EUA). This EUA will remain in effect (meaning this test can be used) for the duration of the COVID-19 declaration under Section 564(b)(1) of the Act, 21 U.S.C. section 360bbb-3(b)(1), unless the authorization is terminated or revoked.     Resp  Syncytial Virus by PCR NEGATIVE NEGATIVE Final    Comment: (NOTE) Fact Sheet for Patients: bloggercourse.com  Fact Sheet for Healthcare Providers: seriousbroker.it  This test is not yet approved or cleared by the [place]  FDA and has been authorized for detection and/or diagnosis of SARS-CoV-2 by FDA under an Emergency Use Authorization (EUA). This EUA will remain in effect (meaning this test can be used) for the duration of the COVID-19 declaration under Section 564(b)(1) of the Act, 21 U.S.C. section 360bbb-3(b)(1), unless the authorization is terminated or revoked.  Performed at [facility], [address] [place], [place] [address]   MRSA Next Gen by PCR, Nasal     Status: None   Collection Time: 07/05/23 12:24 AM   Specimen: Nasal Mucosa; Nasal Swab  Result Value Ref Range Status   MRSA by PCR Next Gen NOT DETECTED NOT DETECTED Final    Comment: (NOTE) The GeneXpert MRSA Assay (FDA approved for NASAL specimens only), is one component of a comprehensive MRSA colonization surveillance program. It is not intended to diagnose MRSA infection nor to guide or monitor treatment for MRSA infections. Test performance is not FDA approved in patients less than 63 years old. Performed at [facility], [address], [place], [place] [address]      Radiology Studies: CT Angio Chest PE W and/or Wo Contrast Result Date: 07/04/2023 CLINICAL DATA:  Increasing shortness of breath EXAM: CT ANGIOGRAPHY CHEST WITH CONTRAST TECHNIQUE: Multidetector CT imaging of the chest was performed using the standard protocol during bolus administration of intravenous contrast. Multiplanar CT image reconstructions and MIPs were obtained to evaluate the vascular anatomy. RADIATION DOSE REDUCTION: This exam was performed according to the departmental dose-optimization program which includes automated exposure control, adjustment of the mA  and/or kV according to patient size and/or use of iterative reconstruction technique. CONTRAST:  75mL OMNIPAQUE  IOHEXOL  350 MG/ML SOLN COMPARISON:  Chest x-ray from earlier in the same day. FINDINGS: Cardiovascular: Thoracic aorta is within normal limits. No aneurysmal dilatation or dissection is noted. Pulmonary artery is enlarged centrally with extensive bilateral pulmonary embolus with evidence of a saddle component centrally. Right ventricle is significantly enlarged consistent with right heart strain. RV/LV ratio calculates at 3. No coronary calcifications are noted. Contrast reflux into the hepatic veins is noted consistent with a degree of elevated right heart pressures. Mediastinum/Nodes: Thoracic inlet is within normal limits. No hilar or mediastinal adenopathy is noted. The esophagus as visualized is within normal limits. Lungs/Pleura: Lungs are well aerated bilaterally. No focal infiltrate or sizable effusion is seen. Upper Abdomen: Visualized upper abdomen shows some nodularity of the adrenal glands bilaterally. Musculoskeletal: No chest wall abnormality. No acute or significant osseous findings. Review of the MIP images confirms the above  findings. IMPRESSION: Positive for acute bilateral PE with CT evidence of right heart strain (RV/LV Ratio = 3) consistent with at least submassive (intermediate risk) PE. The presence of right heart strain has been associated with an increased risk of morbidity and mortality. Please refer to the Code PE Focused order set in [facility] Critical Value/emergent results were called by telephone at the time of interpretation on 07/04/2023 at 9:26 pm to Dr. CAMERON ISAACS , who verbally acknowledged these results. Electronically Signed   By: Oneil Devonshire M.D.   On: 07/04/2023 21:31   DG Chest Port 1 View Result Date: 07/04/2023 CLINICAL DATA:  Shortness of breath with positive COVID test EXAM: PORTABLE CHEST 1 VIEW COMPARISON:  None Available. FINDINGS: Cardiac shadow is within  normal limits. Lungs are well aerated bilaterally. Mild right perihilar opacity is noted consistent with early infiltrate. No sizable effusion is seen. No bony abnormality is noted. IMPRESSION: Mild right perihilar infiltrate. Electronically Signed   By: Oneil Devonshire M.D.   On: 07/04/2023 19:55     Scheduled Meds:  fluticasone   2 spray Each Nare Daily   furosemide   20 mg Intravenous Once   insulin  aspart  0-15 Units Subcutaneous TID WC   oxybutynin   10 mg Oral QHS   Continuous Infusions:  heparin  850 Units/hr (07/06/23 0734)     LOS: 2 days    Time spent:    Sigurd Pac, MD [facility]   07/06/2023, 10:12 AM

## 2023-07-06 NOTE — Progress Notes (Signed)
 VASCULAR LAB    Bilateral lower extremity venous duplex has been performed.  See CV proc for preliminary results.   Oralia Criger, RVT 07/06/2023, 10:39 AM

## 2023-07-06 NOTE — Progress Notes (Signed)
 VASCULAR LAB    IVC Iliac duplex has been performed.  See CV proc for preliminary results.   Lulani Bour, RVT 07/06/2023, 10:40 AM

## 2023-07-06 NOTE — Telephone Encounter (Signed)
 Patient Product/process Development Scientist completed.    The patient is insured through [facility]. Patient has [employer], may use a copay card, and/or apply for patient assistance if available.    Ran test claim for Eliquis  5 mg and the current 30 day co-pay is $20.00.  Ran test claim for Xarelto 20 mg and the current 30 day co-pay is $20.00.  This test claim was processed through [facility]- copay amounts may vary at other pharmacies due to pharmacy/plan contracts, or as the patient moves through the different stages of their insurance plan.     Reyes Sharps, CPHT Pharmacy Technician III Certified Patient Advocate [facility] Patient Advocate Team Direct Number: [phone]  Fax: [phone]

## 2023-07-07 ENCOUNTER — Inpatient Hospital Stay (HOSPITAL_COMMUNITY): Payer: Managed Care, Other (non HMO)

## 2023-07-07 ENCOUNTER — Encounter (HOSPITAL_COMMUNITY): Payer: Self-pay | Admitting: Pulmonary Disease

## 2023-07-07 DIAGNOSIS — U071 COVID-19: Secondary | ICD-10-CM | POA: Diagnosis not present

## 2023-07-07 DIAGNOSIS — I2699 Other pulmonary embolism without acute cor pulmonale: Secondary | ICD-10-CM | POA: Diagnosis not present

## 2023-07-07 DIAGNOSIS — N179 Acute kidney failure, unspecified: Secondary | ICD-10-CM

## 2023-07-07 HISTORY — PX: IR ANGIOGRAM SELECTIVE EACH ADDITIONAL VESSEL: IMG667

## 2023-07-07 HISTORY — PX: IR THROMBECT PRIM MECH INIT (INCLU) MOD SED: IMG2297

## 2023-07-07 HISTORY — PX: IR US GUIDE VASC ACCESS RIGHT: IMG2390

## 2023-07-07 HISTORY — PX: IR ANGIOGRAM PULMONARY BILATERAL SELECTIVE: IMG664

## 2023-07-07 LAB — BASIC METABOLIC PANEL
Anion gap: 10 (ref 5–15)
BUN: 27 mg/dL — ABNORMAL HIGH (ref 8–23)
CO2: 23 mmol/L (ref 22–32)
Calcium: 8.7 mg/dL — ABNORMAL LOW (ref 8.9–10.3)
Chloride: 102 mmol/L (ref 98–111)
Creatinine, Ser: 1.11 mg/dL — ABNORMAL HIGH (ref 0.44–1.00)
GFR, Estimated: 56 mL/min — ABNORMAL LOW (ref >60)
Glucose, Bld: 125 mg/dL — ABNORMAL HIGH (ref 70–99)
Potassium: 4 mmol/L (ref 3.5–5.1)
Sodium: 135 mmol/L (ref 135–145)

## 2023-07-07 LAB — LACTIC ACID, PLASMA: Lactic Acid, Venous: 2.2 mmol/L (ref 0.5–1.9)

## 2023-07-07 LAB — GLUCOSE, CAPILLARY
Glucose-Capillary: 120 mg/dL — ABNORMAL HIGH (ref 70–99)
Glucose-Capillary: 93 mg/dL (ref 70–99)
Glucose-Capillary: 76 mg/dL (ref 70–99)
Glucose-Capillary: 79 mg/dL (ref 70–99)

## 2023-07-07 LAB — POCT ACTIVATED CLOTTING TIME
Activated Clotting Time: 106 s
Activated Clotting Time: 204 s

## 2023-07-07 LAB — HEPARIN LEVEL (UNFRACTIONATED): Heparin Unfractionated: 0.35 [IU]/mL (ref 0.30–0.70)

## 2023-07-07 LAB — CBC
HCT: 34.6 % — ABNORMAL LOW (ref 36.0–46.0)
Hemoglobin: 11.4 g/dL — ABNORMAL LOW (ref 12.0–15.0)
MCH: 27.3 pg (ref 26.0–34.0)
MCHC: 32.9 g/dL (ref 30.0–36.0)
MCV: 83 fL (ref 80.0–100.0)
Platelets: 187 10*3/uL (ref 150–400)
RBC: 4.17 MIL/uL (ref 3.87–5.11)
RDW: 14.3 % (ref 11.5–15.5)
WBC: 14.2 10*3/uL — ABNORMAL HIGH (ref 4.0–10.5)
nRBC: 0.2 % (ref 0.0–0.2)

## 2023-07-07 MED ORDER — MIDAZOLAM HCL 2 MG/2ML IJ SOLN
INTRAMUSCULAR | Status: AC
  Filled 2023-07-07: qty 4

## 2023-07-07 MED ORDER — FENTANYL CITRATE (PF) 100 MCG/2ML IJ SOLN
INTRAMUSCULAR | Status: AC
  Filled 2023-07-07: qty 4

## 2023-07-07 MED ORDER — IOHEXOL 300 MG/ML  SOLN
150.0000 mL | Freq: Once | INTRAMUSCULAR | Status: AC | PRN
  Administered 2023-07-07: 105 mL via INTRA_ARTERIAL

## 2023-07-07 MED ORDER — LIDOCAINE-EPINEPHRINE 1 %-1:100000 IJ SOLN
20.0000 mL | Freq: Once | INTRAMUSCULAR | Status: AC
  Administered 2023-07-07: 6 mL via INTRADERMAL

## 2023-07-07 MED ORDER — HYDRALAZINE HCL 20 MG/ML IJ SOLN
10.0000 mg | INTRAMUSCULAR | Status: DC | PRN
  Administered 2023-07-08: 10 mg via INTRAVENOUS
  Filled 2023-07-07: qty 1

## 2023-07-07 MED ORDER — HEPARIN SODIUM (PORCINE) 1000 UNIT/ML IJ SOLN
INTRAMUSCULAR | Status: AC
  Filled 2023-07-07: qty 10

## 2023-07-07 MED ORDER — HEPARIN SODIUM (PORCINE) 1000 UNIT/ML IJ SOLN
INTRAMUSCULAR | Status: AC | PRN
  Administered 2023-07-07: 7000 [IU] via INTRAVENOUS

## 2023-07-07 MED ORDER — LIDOCAINE-EPINEPHRINE 1 %-1:100000 IJ SOLN
INTRAMUSCULAR | Status: AC
  Filled 2023-07-07: qty 1

## 2023-07-07 MED ORDER — FENTANYL CITRATE (PF) 100 MCG/2ML IJ SOLN
INTRAMUSCULAR | Status: AC | PRN
  Administered 2023-07-07 (×2): 25 ug via INTRAVENOUS

## 2023-07-07 MED ORDER — MIDAZOLAM HCL 2 MG/2ML IJ SOLN
INTRAMUSCULAR | Status: AC | PRN
  Administered 2023-07-07: 1 mg via INTRAVENOUS
  Administered 2023-07-07: .5 mg via INTRAVENOUS

## 2023-07-07 MED ORDER — CHLORHEXIDINE GLUCONATE CLOTH 2 % EX PADS
6.0000 | MEDICATED_PAD | Freq: Every day | CUTANEOUS | Status: DC
  Administered 2023-07-08 – 2023-07-10 (×4): 6 via TOPICAL

## 2023-07-07 MED ORDER — IOHEXOL 350 MG/ML SOLN
75.0000 mL | Freq: Once | INTRAVENOUS | Status: AC | PRN
  Administered 2023-07-07: 75 mL via INTRAVENOUS

## 2023-07-07 MED ORDER — ENSURE ENLIVE PO LIQD
237.0000 mL | Freq: Two times a day (BID) | ORAL | Status: DC
  Administered 2023-07-08 – 2023-07-10 (×5): 237 mL via ORAL

## 2023-07-07 NOTE — Sedation Documentation (Signed)
 PA pressures 70/20 (45) per MD Suttle.

## 2023-07-07 NOTE — Progress Notes (Signed)
 Mobility Specialist Progress Note:    07/07/23 0900  Oxygen Therapy  O2 Device Nasal Cannula  Mobility  Activity Transferred from bed to chair  Level of Assistance Contact guard assist, steadying assist  Assistive Device Other (Comment) (HHA)  Distance Ambulated (ft) 4 ft  Activity Response Tolerated well  Mobility Referral Yes  Mobility visit 1 Mobility  Mobility Specialist Start Time (ACUTE ONLY) 0908  Mobility Specialist Stop Time (ACUTE ONLY) 0950  Mobility Specialist Time Calculation (min) (ACUTE ONLY) 42 min   Pt received in bed, agreeable to mobility. Able to come EOB w/ minA and stand/pivot w/ CG. No complaints throughout. Pt left in chair with call bell and all needs met. RN aware.  D'Vante Nicholaus Mobility Specialist Please contact via Special Educational Needs Teacher or Rehab office at [phone]

## 2023-07-07 NOTE — Consult Note (Signed)
 Patient in radiology for treatment of PE.  Patient has COVID with bilateral femoral DVT.  Will reevaluate patient tomorrow and consider appropriateness of lower extremity mechanical venous thrombectomy  [place] Jameriah Trotti

## 2023-07-07 NOTE — TOC Initial Note (Signed)
 Transition of Care [facility]) - Initial/Assessment Note    Patient Details  Name: Dana Donovan MRN: [id number] Date of Birth: [birth year]  [facility]) CM/SW Contact:    Lauraine FORBES Saa, LCSW Phone Number: 07/07/2023, 3:03 PM   Clinical Narrative:  3:03 PM CSW received call from [facility] [place] [phone]) offering discharge planning services. CSW informed Nathanel that discharge plans have yet to be coordinated but accepted offer.  Transition of Care Asessment: Insurance and Status: Insurance coverage has been reviewed Patient has primary care physician: Yes Home environment has been reviewed: Private Residence   Prior/Current Home Services: No current home services Social Drivers of Health Review: SDOH reviewed needs interventions (Patient declined to answer (housing); other SDOH needs show no concerns) Readmission risk has been reviewed: Yes Transition of care needs: no transition of care needs at this time   Activities of Daily Living   ADL Screening (condition at time of admission) Independently performs ADLs?: Yes (appropriate for developmental age) Is the patient deaf or have difficulty hearing?: No Does the patient have difficulty seeing, even when wearing glasses/contacts?: No Does the patient have difficulty concentrating, remembering, or making decisions?: No  Permission Sought/Granted                  Emotional Assessment              Admission diagnosis:  Acute massive pulmonary embolism (HCC) [I26.99] Pulmonary emboli (HCC) [I26.99] Patient Active Problem List   Diagnosis Date Noted   Pulmonary emboli (HCC) 07/05/2023   Acute massive pulmonary embolism (HCC) 07/05/2023   Acute hypoxemic respiratory failure (HCC) 07/04/2023   AKI (acute kidney injury) (HCC) 07/04/2023   Hyponatremia with decreased serum osmolality 07/04/2023   Troponin level elevated 07/04/2023   History of hypertension 07/04/2023   Class 2 obesity due to excess  calories with body mass index (BMI) of 38.0 to 38.9 in adult 07/04/2023   Hyperglycemia 07/04/2023   COVID-19 virus detected 07/04/2023   PCP:  Elliot Charm, MD Pharmacy:   [id number] [place], [place] - [address] AT [facility] [address] [place] [address] Phone: [phone] Fax: [phone]     Social Drivers of Health (SDOH) Social History: SDOH Screenings   Food Insecurity: No Food Insecurity (07/06/2023)  Housing: Patient Declined (07/06/2023)  Transportation Needs: No Transportation Needs (07/06/2023)  Utilities: Not At Risk (07/06/2023)  Tobacco Use: Low Risk  (07/07/2023)   SDOH Interventions:     Readmission Risk Interventions     No data to display

## 2023-07-07 NOTE — Consult Note (Addendum)
 Chief Complaint: Patient was seen in consultation today for SOB, tachycardia, PE at the request of Isaacs,Cameron  Referring Physician(s): Harold Scholz   Supervising Physician: Jennefer Rover  Patient Status: [facility] - In-pt  History of Present Illness: Dana Donovan is a 63 y.o. adult with PMHs of recent diagnosis of COVID, syncopal episode and SOB who was admitted after found to have PE, IR was consulted for possible interventions.   Patient came to [facility] ED on 1/4 due to SOB, CAT chest showed acute bilateral PE with CT evidence of right heart strain. BNP and troponin were elevated. Patient was started on half dose TPA and transferred to [facility] for backup options incase she progressed despite TPA therapy. Patient continued to have tachycardia and SOB, echo today showed severe right heart failure, repeat CTA obtained today which showed resolution of saddle PE but RV LV ratio are unchanged, IR was consulted for possible interventions.   Patient seen w/ Dr. Jennefer.  Sitting in bed, NAD, on O2 via [place].  Reports SOB, no other complaints.  Had clear liquid around 1:30 pm today.   CTA/echo findings were discussed with the patient, risks and benefits of PE interventions were discussed in detail. After thorough discussion and shared decision making, patient decided to proceed.  Patient was informed that she may not be able to get full moderate sedation due to being on clear liquid diet till 1:30 pm, patient verbalized understanding and ok to proceed with one drug if necessary. Patient is being transferred to 41M per RN.   Patient's spouse was called for update, all questions answered.   History reviewed. No pertinent past medical history.  History reviewed. No pertinent surgical history.  Allergies: Patient has no known allergies.  Medications: Prior to Admission medications   Medication Sig Start Date End Date Taking? Authorizing Provider  albuterol  (PROVENTIL ) (2.5 MG/3ML) 0.083%  nebulizer solution Take 2.5 mg by nebulization every 6 (six) hours as needed for shortness of breath or wheezing. 05/30/18  Yes [provider]  albuterol  (VENTOLIN  HFA) 108 (90 Base) MCG/ACT inhaler Inhale 2 puffs into the lungs every 6 (six) hours as needed for wheezing or shortness of breath.   Yes [provider]  diclofenac (VOLTAREN) 75 MG EC tablet Take 75 mg by mouth 2 (two) times daily as needed for mild pain (pain score 1-3).   Yes [provider]  DRY EYE RELIEF DROPS 0.2-0.2-1 % SOLN Place 1 drop into both eyes in the morning, at noon, and at bedtime.   Yes [provider]  fluticasone  (FLONASE ) 50 MCG/ACT nasal spray Place 2 sprays into both nostrils daily. Patient taking differently: Place 2 sprays into both nostrils daily as needed for allergies or rhinitis. 06/30/23  Yes Vivienne Delon HERO, PA-C  latanoprost (XALATAN) 0.005 % ophthalmic solution Place 1 drop into both eyes at bedtime.   Yes [provider]  lisinopril  (PRINIVIL ,ZESTRIL ) 40 MG tablet Take 40 mg by mouth daily. 06/22/18  Yes [provider]  oxybutynin  (DITROPAN -XL) 10 MG 24 hr tablet Take 10 mg by mouth at bedtime. 05/17/18  Yes [provider]  PRESCRIPTION MEDICATION See admin instructions. CPAP- At bedtime   Yes [provider]  promethazine -dextromethorphan (PROMETHAZINE -DM) 6.25-15 MG/5ML syrup Take 5 mLs by mouth 4 (four) times daily as needed. Patient taking differently: Take 5 mLs by mouth 4 (four) times daily as needed for cough. 06/30/23  Yes Vivienne Delon HERO, PA-C  Semaglutide -Weight Management (WEGOVY ) 2.4 MG/0.75ML SOAJ Inject 2.4 mg into  the skin once a week. Patient taking differently: Inject 2.4 mg into the skin every Thursday. 05/06/23  Yes   triamterene -hydrochlorothiazide  (MAXZIDE ) 75-50 MG tablet Take 1 tablet by mouth daily.   Yes [provider]  TYLENOL  500 MG tablet Take 500-1,000 mg by mouth every 6 (six) hours  as needed for mild pain (pain score 1-3), headache or fever.   Yes [provider]  COVID-19 At Home Antigen Test KIT Use as directed. Patient not taking: Reported on 07/05/2023 06/30/23   Vivienne Delon HERO, PA-C  Semaglutide -Weight Management (WEGOVY ) 1 MG/0.5ML SOAJ Inject 1 mg into the skin once a week. Patient not taking: Reported on 07/05/2023 03/05/23     Semaglutide -Weight Management (WEGOVY ) 1.7 MG/0.75ML SOAJ Inject 1.7 mg into the skin once a week. Patient not taking: Reported on 07/05/2023 04/02/23     Semaglutide -Weight Management (WEGOVY ) 2.4 MG/0.75ML SOAJ Inject 2.4 mg into the skin every 7 (seven) days. Patient not taking: Reported on 07/05/2023 06/17/23        Family History  Problem Relation Age of Onset   Breast cancer Neg Hx     Social History   Socioeconomic History   Marital status: Married    Spouse name: Not on file   Number of children: Not on file   Years of education: Not on file   Highest education level: Not on file  Occupational History   Not on file  Tobacco Use   Smoking status: Never   Smokeless tobacco: Never  Substance and Sexual Activity   Alcohol use: Never   Drug use: Never   Sexual activity: Not on file  Other Topics Concern   Not on file  Social History Narrative   Not on file   Social Drivers of Health   Financial Resource Strain: Not on file  Food Insecurity: No Food Insecurity (07/06/2023)   Hunger Vital Sign    Worried About Running Out of Food in the Last Year: Never true    Ran Out of Food in the Last Year: Never true  Transportation Needs: No Transportation Needs (07/06/2023)   PRAPARE - Administrator, Civil Service (Medical): No    Lack of Transportation (Non-Medical): No  Physical Activity: Not on file  Stress: Not on file  Social Connections: Not on file     Review of Systems: A 12 point ROS discussed and pertinent positives are indicated in the HPI above.  All other systems are negative.  Vital  Signs: BP (!) 155/106 (BP Location: Right Arm)   Pulse (!) 105   Temp 97.6 F (36.4 C) (Oral)   Resp 20   Ht 5' (1.524 m)   Wt 198 lb 6.6 oz (90 kg)   SpO2 90%   BMI 38.75 kg/m    Physical Exam Vitals reviewed.  Constitutional:      General: She is not in acute distress.    Appearance: She is not ill-appearing.  HENT:     Head: Normocephalic and atraumatic.     Mouth/Throat:     Mouth: Mucous membranes are moist.     Pharynx: Oropharynx is clear.  Cardiovascular:     Rate and Rhythm: Regular rhythm. Tachycardia present.  Pulmonary:     Effort: Pulmonary effort is normal.     Breath sounds: Normal breath sounds.  Abdominal:     General: Abdomen is flat. Bowel sounds are normal.     Palpations: Abdomen is soft.  Musculoskeletal:     Cervical back:  Neck supple.  Skin:    General: Skin is warm and dry.     Coloration: Skin is not jaundiced or pale.  Neurological:     Mental Status: She is alert and oriented to person, place, and time.  Psychiatric:        Mood and Affect: Mood normal.        Behavior: Behavior normal.        Judgment: Judgment normal.     MD Evaluation Airway: WNL Heart: WNL Abdomen: WNL Chest/ Lungs: WNL ASA  Classification: 3 Mallampati/Airway Score: Two  Imaging: CT Angio Abd/Pel w/ and/or w/o Result Date: 07/07/2023 CLINICAL DATA:  63 year old female with acute PE on CTA 3 days ago. Saddle embolus at that time. Extensive DVT. Query IVC thrombus. Re-evaluate PE. EXAM: CTA ABDOMEN AND PELVIS WITHOUT AND WITH CONTRAST TECHNIQUE: Multidetector CT imaging of the abdomen and pelvis was performed using the standard protocol during bolus administration of intravenous contrast. Multiplanar reconstructed images and MIPs were obtained and reviewed to evaluate the vascular anatomy. RADIATION DOSE REDUCTION: This exam was performed according to the departmental dose-optimization program which includes automated exposure control, adjustment of the mA and/or kV  according to patient size and/or use of iterative reconstruction technique. CONTRAST:  75mL OMNIPAQUE  IOHEXOL  350 MG/ML SOLN COMPARISON:  CTA chest today reported separately. CTA chest on 07/04/2023. FINDINGS: VASCULAR Reflux of right atrial contrast into the hepatic veins and hepatic IVC appearing unchanged from the CTA on 07/04/2023. Otherwise no inferior vena cava contrast is present. Venous Vascular patency is not evaluated in the absence of IV contrast. Abdominal aorta timed vascular contrast. Normal caliber abdominal aorta and major arterial structures in the abdomen and pelvis appear patent and unremarkable. Review of the MIP images confirms the above findings. NON-VASCULAR Lower chest: Lower lobe PE stable to that reported separately today. Hepatobiliary: Cholecystectomy. Largely noncontrast appearance of the liver except for hepatic venous contrast reflux. No liver abnormality identified. Pancreas: Negative. Spleen: Negative. Adrenals/Urinary Tract: Bilateral low-density adrenal gland thickening compatible with hyperplasia. Nonobstructed kidneys with symmetric renal enhancement. Small exophytic simple fluid density left renal midpole cyst suspected on series 5, image 70 (no follow-up imaging recommended). Decompressed ureters and bladder. Numerous pelvic phleboliths. Stomach/Bowel: Extensive large bowel diverticulosis from the hepatic flexure through the sigmoid colon. No active inflammation identified in those segments. Normal appendix on series 5, image 119. Decompressed terminal ileum and no dilated small bowel. Decompressed stomach and duodenum. No free air or free fluid. Lymphatic: No lymphadenopathy in the abdomen or pelvis. Reproductive: Within normal limits. Other: No pelvis free fluid. Musculoskeletal: Widespread severe lumbar spine degeneration. Extensive vacuum disc and vacuum facet degenerative changes. Multifactorial spinal stenosis appears maximal in severe at L4-L5. No acute or suspicious  osseous abnormality identified. IMPRESSION: 1. Aortic vascular contrast timing on this CTA abdomen and pelvis. Vena cava, Venous patency is not evaluated. A routine CT Abdomen and Pelvis with standard portal venous and delayed renal excretory phase IV contrast timing would best evaluate the IVC. Reflux of right heart contrast to the hepatic veins appears unchanged since 07/04/2023. 2. Abnormal CTA Chest today is reported separately. 3. No other acute or inflammatory process identified in the abdomen or pelvis. Extensive large bowel diverticulosis. Severe lumbar spine degeneration with multifactorial spinal stenosis. Electronically Signed   By: VEAR Hurst M.D.   On: 07/07/2023 12:36   CT Angio Chest Pulmonary Embolism (PE) W or WO Contrast Result Date: 07/07/2023 CLINICAL DATA:  63 year old female with acute PE on CTA  3 days ago. Saddle embolus at that time. Extensive DVT. Query IVC thrombus. Re-evaluate PE. EXAM: CT ANGIOGRAPHY CHEST WITH CONTRAST TECHNIQUE: Multidetector CT imaging of the chest was performed using the standard protocol during bolus administration of intravenous contrast. Multiplanar CT image reconstructions and MIPs were obtained to evaluate the vascular anatomy. RADIATION DOSE REDUCTION: This exam was performed according to the departmental dose-optimization program which includes automated exposure control, adjustment of the mA and/or kV according to patient size and/or use of iterative reconstruction technique. CONTRAST:  75mL OMNIPAQUE  IOHEXOL  350 MG/ML SOLN COMPARISON:  CTA chest 07/04/2023. FINDINGS: Cardiovascular: Good contrast bolus timing in the pulmonary arterial tree. Central pulmonary artery enlargement is stable since 07/04/2023. Saddle thrombus has resolved since that time but there is ongoing bilateral lobar pulmonary embolus, bulky in the right lung and left lower lobe. RV LV ratio remains abnormal (series 5, image 185. No pericardial effusion. Little contrast in the thoracic aorta.  Mediastinum/Nodes: Stable and negative, no mediastinal mass or lymphadenopathy. Lungs/Pleura: Major airways remain patent. Lung volumes are mildly improved, less respiratory motion. No discrete pulmonary infarct. No consolidation or pleural effusion. Upper Abdomen: CTA abdomen and pelvis today reported separately. Musculoskeletal: Stable. No acute or suspicious osseous lesion in the chest. Lower cervical spine and midthoracic endplate degeneration and spurring. Review of the MIP images confirms the above findings. IMPRESSION: 1. Ongoing bilateral lobar Pulmonary Emboli. Saddle thrombus has resolved since 07/04/2023, central pulmonary artery enlargement and abnormal RV LV ratio are unchanged. 2. No pulmonary infarct.  No pleural or pericardial effusion. 3. CTA Abdomen and Pelvis today reported separately. Electronically Signed   By: VEAR Hurst M.D.   On: 07/07/2023 12:27   ECHOCARDIOGRAM COMPLETE Result Date: 07/06/2023    ECHOCARDIOGRAM REPORT   Patient Name:   Dana Donovan Date of Exam: 07/06/2023 Medical Rec #:  [id number]            Height:       60.0 in Accession #:    [id number]           Weight:       203.3 lb Date of Birth:  10/04/1960           BSA:          1.879 m Patient Age:    62 years             BP:           153/86 mmHg Patient Gender: F                    HR:           102 bpm. Exam Location:  Inpatient Procedure: 2D Echo, Cardiac Doppler, Color Doppler and Intracardiac            Opacification Agent Indications:    Pulmonary Embolus I26.09  History:        Patient has no prior history of Echocardiogram examinations.                 Risk Factors:Hypertension.  Sonographer:    Thea Norlander RCS Referring Phys: [phone] PETER E BABCOCK IMPRESSIONS  1. Left ventricular ejection fraction, by estimation, is >75%. The left ventricle has hyperdynamic function. The left ventricle has no regional wall motion abnormalities. Left ventricular diastolic parameters are consistent with Grade I diastolic  dysfunction (impaired relaxation).  2. Right ventricular systolic function is severely reduced. The right ventricular size is moderately enlarged. There is mildly elevated pulmonary artery systolic  pressure. The estimated right ventricular systolic pressure is 43.5 mmHg.  3. Right atrial size was mildly dilated.  4. The mitral valve is normal in structure. No evidence of mitral valve regurgitation.  5. The aortic valve is tricuspid. Aortic valve regurgitation is not visualized.  6. The inferior vena cava is dilated in size with <50% respiratory variability, suggesting right atrial pressure of 15 mmHg. Comparison(s): No prior Echocardiogram. Conclusion(s)/Recommendation(s): Findings suggestive of significant RV strain in the setting of pulmonary embolus. FINDINGS  Left Ventricle: Left ventricular ejection fraction, by estimation, is >75%. The left ventricle has hyperdynamic function. The left ventricle has no regional wall motion abnormalities. Definity  contrast agent was given IV to delineate the left ventricular endocardial borders. The left ventricular internal cavity size was normal in size. There is no left ventricular hypertrophy. Left ventricular diastolic parameters are consistent with Grade I diastolic dysfunction (impaired relaxation). Indeterminate filling pressures. Right Ventricle: The right ventricular size is moderately enlarged. No increase in right ventricular wall thickness. Right ventricular systolic function is severely reduced. There is mildly elevated pulmonary artery systolic pressure. The tricuspid regurgitant velocity is 2.67 m/s, and with an assumed right atrial pressure of 15 mmHg, the estimated right ventricular systolic pressure is 43.5 mmHg. Left Atrium: Left atrial size was normal in size. Right Atrium: Right atrial size was mildly dilated. Pericardium: There is no evidence of pericardial effusion. Mitral Valve: The mitral valve is normal in structure. No evidence of mitral valve  regurgitation. Tricuspid Valve: The tricuspid valve is grossly normal. Tricuspid valve regurgitation is trivial. Aortic Valve: The aortic valve is tricuspid. Aortic valve regurgitation is not visualized. Aortic valve peak gradient measures 9.9 mmHg. Pulmonic Valve: The pulmonic valve was normal in structure. Pulmonic valve regurgitation is not visualized. Aorta: The aortic root and ascending aorta are structurally normal, with no evidence of dilitation. Venous: The inferior vena cava is dilated in size with less than 50% respiratory variability, suggesting right atrial pressure of 15 mmHg. IAS/Shunts: No atrial level shunt detected by color flow Doppler.  LEFT VENTRICLE PLAX 2D LVIDd:         3.80 cm   Diastology LVIDs:         1.50 cm   LV e' medial:    8.27 cm/s LV PW:         1.00 cm   LV E/e' medial:  10.7 LV IVS:        0.90 cm   LV e' lateral:   11.50 cm/s LVOT diam:     1.90 cm   LV E/e' lateral: 7.7 LV SV:         43 LV SV Index:   23 LVOT Area:     2.84 cm  RIGHT VENTRICLE             IVC RV S prime:     16.70 cm/s  IVC diam: 2.10 cm TAPSE (M-mode): 1.8 cm LEFT ATRIUM           Index        RIGHT ATRIUM           Index LA diam:      2.60 cm 1.38 cm/m   RA Area:     17.10 cm LA Vol (A2C): 28.5 ml 15.16 ml/m  RA Volume:   47.10 ml  25.06 ml/m LA Vol (A4C): 25.7 ml 13.67 ml/m  AORTIC VALVE AV Area (Vmax): 1.77 cm AV Vmax:        157.00 cm/s AV Peak  Grad:   9.9 mmHg LVOT Vmax:      97.82 cm/s LVOT Vmean:     62.575 cm/s LVOT VTI:       0.153 m  AORTA Ao Root diam: 2.50 cm Ao Asc diam:  3.10 cm MITRAL VALVE                TRICUSPID VALVE MV Area (PHT): 5.31 cm     TR Peak grad:   28.5 mmHg MV Decel Time: 143 msec     TR Vmax:        267.00 cm/s MV E velocity: 88.10 cm/s MV A velocity: 128.00 cm/s  SHUNTS MV E/A ratio:  0.69         Systemic VTI:  0.15 m                             Systemic Diam: 1.90 cm Vinie Maxcy MD Electronically signed by Vinie Maxcy MD Signature Date/Time: 07/06/2023/4:39:02 PM     Final    VAS US  IVC/ILIAC (VENOUS ONLY) Result Date: 07/06/2023 IVC/ILIAC STUDY Patient Name:  Dana Donovan  Date of Exam:   07/06/2023 Medical Rec #: [id number]             Accession #:    [id number] Date of Birth: 12-17-1960            Patient Gender: F Patient Age:   73 years Exam Location:  [facility] Procedure:      VAS US  IVC/ILIAC (VENOUS ONLY) Referring Phys: --------------------------------------------------------------------------------  Indications: Extensive DVT and massive PE Other Factors: Covid 19 infection.  Comparison Study: No prior study on file Performing Technologist: Alberta Lis RVS  Examination Guidelines: A complete evaluation includes B-mode imaging, spectral Doppler, color Doppler, and power Doppler as needed of all accessible portions of each vessel. Bilateral testing is considered an integral part of a complete examination. Limited examinations for reoccurring indications may be performed as noted.  IVC/Iliac Findings: +----------+------+--------+----------------+    IVC    PatentThrombus    Comments     +----------+------+--------+----------------+ IVC Prox         acute  partial thrombus +----------+------+--------+----------------+ IVC Mid          acute  partial thrombus +----------+------+--------+----------------+ IVC Distal       acute  partial thrombus +----------+------+--------+----------------+  +------------------+---------+-----------+---------+-----------+---------------+        CIV        RT-PatentRT-ThrombusLT-PatentLT-Thrombus   Comments     +------------------+---------+-----------+---------+-----------+---------------+ Common Iliac Prox  patent                         acute       partial                                                                  thrombus     +------------------+---------+-----------+---------+-----------+---------------+ Common Iliac Mid   patent                         acute        partial  thrombus     +------------------+---------+-----------+---------+-----------+---------------+ Common Iliac       patent                         acute       partial     Distal                                                       thrombus     +------------------+---------+-----------+---------+-----------+---------------+  +-----------------+---------+-----------+---------+-----------+----------------+        EIV       RT-PatentRT-ThrombusLT-PatentLT-Thrombus    Comments     +-----------------+---------+-----------+---------+-----------+----------------+ External Iliac    patent                         acute   partial thrombus Vein Prox                                                                 +-----------------+---------+-----------+---------+-----------+----------------+ External Iliac    patent                         acute   partial thrombus Vein Mid                                                                  +-----------------+---------+-----------+---------+-----------+----------------+ External Iliac    patent                         acute   partial thrombus Vein Distal                                                               +-----------------+---------+-----------+---------+-----------+----------------+  Summary: IVC/Iliac: Partial acute thrombus noted throughout the left external and common iliac veins and the IVC.  *See table(s) above for measurements and observations.  Electronically signed by Lonni Gaskins MD on 07/06/2023 at 1:51:45 PM.    Final    VAS US  LOWER EXTREMITY VENOUS (DVT) Result Date: 07/06/2023  Lower Venous DVT Study Patient Name:  LAMIRA BORIN  Date of Exam:   07/06/2023 Medical Rec #: [id number]             Accession #:    [id number] Date of Birth: [birth year]            Patient Gender: F Patient Age:   67 years Exam  Location:  [facility] Procedure:      VAS US  LOWER EXTREMITY VENOUS (DVT) Referring Phys: DORN CHILL --------------------------------------------------------------------------------  Indications: Massive Pulmonary embolism, SOB, and Covid 19 infection.  Anticoagulation: Heparin . Comparison Study: No  prior study on file Performing Technologist: Alberta Lis RVS  Examination Guidelines: A complete evaluation includes B-mode imaging, spectral Doppler, color Doppler, and power Doppler as needed of all accessible portions of each vessel. Bilateral testing is considered an integral part of a complete examination. Limited examinations for reoccurring indications may be performed as noted. The reflux portion of the exam is performed with the patient in reverse Trendelenburg.  +---------+---------------+---------+-----------+---------------+-------------+ RIGHT    CompressibilityPhasicitySpontaneityProperties     Thrombus                                                                 Aging         +---------+---------------+---------+-----------+---------------+-------------+ CFV      Full           Yes      No         pulsatile                                                                waveform                     +---------+---------------+---------+-----------+---------------+-------------+ SFJ      Full                                                            +---------+---------------+---------+-----------+---------------+-------------+ FV Prox  Partial                                           Acute         +---------+---------------+---------+-----------+---------------+-------------+ FV Mid   Partial        No       No                        Acute         +---------+---------------+---------+-----------+---------------+-------------+ FV DistalPartial        Yes      No         pulsatile      Acute                                                      waveform                     +---------+---------------+---------+-----------+---------------+-------------+ PFV      Partial  Acute         +---------+---------------+---------+-----------+---------------+-------------+ POP      Partial        No       No                        Acute         +---------+---------------+---------+-----------+---------------+-------------+ PTV      None                                              Acute         +---------+---------------+---------+-----------+---------------+-------------+ PERO     None                                              Acute         +---------+---------------+---------+-----------+---------------+-------------+ Gastroc  Partial        Yes      No         pulsatile      Acute                                                     waveform                     +---------+---------------+---------+-----------+---------------+-------------+   +---------+---------------+---------+-----------+---------------+-------------+ LEFT     CompressibilityPhasicitySpontaneityProperties     Thrombus                                                                 Aging         +---------+---------------+---------+-----------+---------------+-------------+ CFV      Partial        Yes      No         pulsatile      Acute                                                     waveform                     +---------+---------------+---------+-----------+---------------+-------------+ SFJ      Partial                                           Acute         +---------+---------------+---------+-----------+---------------+-------------+ FV Prox  Partial        No       No                        Acute         +---------+---------------+---------+-----------+---------------+-------------+  FV Mid   None           No       No                         Acute         +---------+---------------+---------+-----------+---------------+-------------+ FV DistalNone           No       No                        Acute         +---------+---------------+---------+-----------+---------------+-------------+ PFV      Full           Yes      No         pulsatile      Acute                                                     waveform                     +---------+---------------+---------+-----------+---------------+-------------+ POP      None           No       No                        Acute         +---------+---------------+---------+-----------+---------------+-------------+ PTV      None                                              Acute         +---------+---------------+---------+-----------+---------------+-------------+ PERO     None                                              Acute         +---------+---------------+---------+-----------+---------------+-------------+ Gastroc  None           No       No                        Acute         +---------+---------------+---------+-----------+---------------+-------------+     Summary: RIGHT: - Findings consistent with acute deep vein thrombosis involving the right femoral vein, right proximal profunda vein, right popliteal vein, right posterior tibial veins, and right peroneal veins. Findings consistent with acute intramuscular thrombosis involving the right gastrocnemius veins. - No cystic structure found in the popliteal fossa. pulsatile waveforms noted  LEFT: - Findings consistent with acute deep vein thrombosis involving the left common femoral vein, SF junction, left femoral vein, left popliteal vein, left posterior tibial veins, and left peroneal veins. Findings consistent with acute intramuscular thrombosis involving the left gastrocnemius veins. - No cystic structure found in the popliteal fossa. Pulsatile waveforms noted.  *See table(s) above for measurements  and observations. Electronically signed by Lonni Gaskins MD on 07/06/2023 at 1:51:07 PM.    Final    CT Angio Chest  PE W and/or Wo Contrast Result Date: 07/04/2023 CLINICAL DATA:  Increasing shortness of breath EXAM: CT ANGIOGRAPHY CHEST WITH CONTRAST TECHNIQUE: Multidetector CT imaging of the chest was performed using the standard protocol during bolus administration of intravenous contrast. Multiplanar CT image reconstructions and MIPs were obtained to evaluate the vascular anatomy. RADIATION DOSE REDUCTION: This exam was performed according to the departmental dose-optimization program which includes automated exposure control, adjustment of the mA and/or kV according to patient size and/or use of iterative reconstruction technique. CONTRAST:  75mL OMNIPAQUE  IOHEXOL  350 MG/ML SOLN COMPARISON:  Chest x-ray from earlier in the same day. FINDINGS: Cardiovascular: Thoracic aorta is within normal limits. No aneurysmal dilatation or dissection is noted. Pulmonary artery is enlarged centrally with extensive bilateral pulmonary embolus with evidence of a saddle component centrally. Right ventricle is significantly enlarged consistent with right heart strain. RV/LV ratio calculates at 3. No coronary calcifications are noted. Contrast reflux into the hepatic veins is noted consistent with a degree of elevated right heart pressures. Mediastinum/Nodes: Thoracic inlet is within normal limits. No hilar or mediastinal adenopathy is noted. The esophagus as visualized is within normal limits. Lungs/Pleura: Lungs are well aerated bilaterally. No focal infiltrate or sizable effusion is seen. Upper Abdomen: Visualized upper abdomen shows some nodularity of the adrenal glands bilaterally. Musculoskeletal: No chest wall abnormality. No acute or significant osseous findings. Review of the MIP images confirms the above findings. IMPRESSION: Positive for acute bilateral PE with CT evidence of right heart strain (RV/LV Ratio = 3)  consistent with at least submassive (intermediate risk) PE. The presence of right heart strain has been associated with an increased risk of morbidity and mortality. Please refer to the Code PE Focused order set in [facility] Critical Value/emergent results were called by telephone at the time of interpretation on 07/04/2023 at 9:26 pm to Dr. CAMERON ISAACS , who verbally acknowledged these results. Electronically Signed   By: Oneil Devonshire M.D.   On: 07/04/2023 21:31   DG Chest Port 1 View Result Date: 07/04/2023 CLINICAL DATA:  Shortness of breath with positive COVID test EXAM: PORTABLE CHEST 1 VIEW COMPARISON:  None Available. FINDINGS: Cardiac shadow is within normal limits. Lungs are well aerated bilaterally. Mild right perihilar opacity is noted consistent with early infiltrate. No sizable effusion is seen. No bony abnormality is noted. IMPRESSION: Mild right perihilar infiltrate. Electronically Signed   By: Oneil Devonshire M.D.   On: 07/04/2023 19:55    Labs:  CBC: Recent Labs    07/04/23 1937 07/05/23 0125 07/06/23 0212 07/07/23 0229  WBC 13.0* 14.9* 13.4* 14.2*  HGB 13.1 13.1 10.8* 11.4*  HCT 40.4 40.2 33.2* 34.6*  PLT 131* 114* 153 187    COAGS: Recent Labs    07/04/23 2022 07/05/23 0125  INR 1.1 1.3*  APTT 26 50*    BMP: Recent Labs    07/04/23 1937 07/05/23 0416 07/07/23 0229  NA 131* 136 135  K 3.6 3.8 4.0  CL 96* 102 102  CO2 20* 19* 23  GLUCOSE 235* 114* 125*  BUN 30* 28* 27*  CALCIUM 8.7* 8.4* 8.7*  CREATININE 1.57* 1.33* 1.11*  GFRNONAA 37* 45* 56*    LIVER FUNCTION TESTS: Recent Labs    07/04/23 2022  BILITOT 0.6  AST 29  ALT 24  ALKPHOS 78  PROT 7.6  ALBUMIN 3.6    TUMOR MARKERS: No results for input(s): AFPTM, CEA, CA199, CHROMGRNA in the last 8760 hours.  Assessment and Plan: 63 y.o. adult with bilateral  PE with right heart strain who presents for intervention for PE.   Had clear liquid 1 hour ago, discussed with patient and OK to  proceed with one drug.   VS tachycardic 105, BP hypertensive, O2 in low 90 s on 3L  CBC WBC 14.2, RF BUN 27, creatinine 1.11, GFR 56  NKDA  on heparin  gtt   Risks and benefits of PE thrombectomy discussed with the patient including, but not limited to bleeding, pulmonary hemorrhage, perforation or dissection of cardiovascular structures, vascular injury, stroke, contrast induced renal failure, and infection.  Informed consent obtained, in IR.  Will proceed today.   Thank you for this interesting consult.  I greatly enjoyed meeting Dniya Neuhaus and look forward to participating in their care.  A copy of this report was sent to the requesting provider on this date.  Electronically Signed: Toya VEAR Cousin, PA-C 07/07/2023, 2:40 PM   I spent a total of 40 Minutes    in face to face in clinical consultation, greater than 50% of which was counseling/coordinating care for bilateral symptomatic PE.   This chart was dictated using voice recognition software.  Despite best efforts to proofread,  errors can occur which can change the documentation meaning.

## 2023-07-07 NOTE — Procedures (Addendum)
 Interventional Radiology Procedure Note  Procedure: Pulmonary embolism thrombectomy  Findings: Please refer to procedural dictation for full description. Aspiration thrombectomy of bilateral pulmonary arteries.  Mean pulmonary artery pressure reduction from 46 mmHg to 31 mmHg.  Immediate resolution of tachycardia and hypoxia.  Right groin 24 Fr access, closed with 2 proglides.  Some venous bleeding after closure managed with figure of 8 cutaneous stitch and manual compression.    Complications: None immediate  Estimated Blood Loss: 50 mL  Recommendations: Consider switching to therapeutic lovenox versus oral anticoagulation, with consideration of possible DVT intervention with Vascular Surgery. IR will follow.   Ester Sides, MD Pager: [phone]

## 2023-07-07 NOTE — Sedation Documentation (Signed)
 Restart heparin gtt at previous rate per MD [facility]

## 2023-07-07 NOTE — Progress Notes (Signed)
 PHARMACY - ANTICOAGULATION CONSULT NOTE  Pharmacy Consult for heparin  Indication: pulmonary embolus  No Known Allergies  Patient Measurements: Height: 5' (152.4 cm) Weight: 90 kg (198 lb 6.6 oz) IBW/kg (Calculated) : 45.5 Heparin  Dosing Weight: 70kg  Vital Signs: Temp: 97.6 F (36.4 C) (01/07 0725) Temp Source: Oral (01/07 0725) BP: 139/82 (01/07 0725) Pulse Rate: 97 (01/07 0725)  Labs: Recent Labs    07/04/23 1937 07/04/23 2022 07/05/23 0125 07/05/23 0416 07/05/23 1025 07/05/23 1820 07/06/23 0212 07/07/23 0229  HGB 13.1  --  13.1  --   --   --  10.8* 11.4*  HCT 40.4  --  40.2  --   --   --  33.2* 34.6*  PLT 131*  --  114*  --   --   --  153 187  APTT  --  26 50*  --   --   --   --   --   LABPROT  --  14.0 16.6*  --   --   --   --   --   INR  --  1.1 1.3*  --   --   --   --   --   HEPARINUNFRC  --   --   --   --    < > 0.41 0.39 0.35  CREATININE 1.57*  --   --  1.33*  --   --   --  1.11*  TROPONINIHS 174* 201*  --   --   --   --   --   --    < > = values in this interval not displayed.    Estimated Creatinine Clearance (by C-G formula based on SCr of 1.11 mg/dL (H)) Female: 48.1 mL/min (A) Female: 63.6 mL/min (A)   Medical History: History reviewed. No pertinent past medical history.  Medications:  Medications Prior to Admission  Medication Sig Dispense Refill Last Dose/Taking   albuterol  (PROVENTIL ) (2.5 MG/3ML) 0.083% nebulizer solution Take 2.5 mg by nebulization every 6 (six) hours as needed for shortness of breath or wheezing.   Past Week   albuterol  (VENTOLIN  HFA) 108 (90 Base) MCG/ACT inhaler Inhale 2 puffs into the lungs every 6 (six) hours as needed for wheezing or shortness of breath.   Past Week   diclofenac (VOLTAREN) 75 MG EC tablet Take 75 mg by mouth 2 (two) times daily as needed for mild pain (pain score 1-3).   Taking As Needed   DRY EYE RELIEF DROPS 0.2-0.2-1 % SOLN Place 1 drop into both eyes in the morning, at noon, and at bedtime.   07/04/2023    fluticasone  (FLONASE ) 50 MCG/ACT nasal spray Place 2 sprays into both nostrils daily. (Patient taking differently: Place 2 sprays into both nostrils daily as needed for allergies or rhinitis.) 16 g 0 Taking Differently   latanoprost (XALATAN) 0.005 % ophthalmic solution Place 1 drop into both eyes at bedtime.   07/03/2023   lisinopril  (PRINIVIL ,ZESTRIL ) 40 MG tablet Take 40 mg by mouth daily.   06/30/2023   [EXPIRED] nirmatrelvir /ritonavir  (PAXLOVID ) 20 x 150 MG & 10 x 100MG  TABS Take 3 tablets by mouth 2 (two) times daily for 5 days. (Take nirmatrelvir  150 mg two tablets twice daily for 5 days and ritonavir  100 mg one tablet twice daily for 5 days) 30 tablet 0 Past Week   oxybutynin  (DITROPAN -XL) 10 MG 24 hr tablet Take 10 mg by mouth at bedtime.   06/30/2023   PRESCRIPTION MEDICATION See admin instructions. CPAP- At  bedtime   07/03/2023   promethazine -dextromethorphan (PROMETHAZINE -DM) 6.25-15 MG/5ML syrup Take 5 mLs by mouth 4 (four) times daily as needed. (Patient taking differently: Take 5 mLs by mouth 4 (four) times daily as needed for cough.) 118 mL 0 Past Week   Semaglutide -Weight Management (WEGOVY ) 2.4 MG/0.75ML SOAJ Inject 2.4 mg into the skin once a week. (Patient taking differently: Inject 2.4 mg into the skin every Thursday.) 3 mL 2 06/25/2023   triamterene -hydrochlorothiazide  (MAXZIDE ) 75-50 MG tablet Take 1 tablet by mouth daily.   06/30/2023   TYLENOL  500 MG tablet Take 500-1,000 mg by mouth every 6 (six) hours as needed for mild pain (pain score 1-3), headache or fever.   Past Week   COVID-19 At Home Antigen Test KIT Use as directed. (Patient not taking: Reported on 07/05/2023) 2 each 0 Not Taking   Semaglutide -Weight Management (WEGOVY ) 1 MG/0.5ML SOAJ Inject 1 mg into the skin once a week. (Patient not taking: Reported on 07/05/2023) 2 mL 1 Not Taking   Semaglutide -Weight Management (WEGOVY ) 1.7 MG/0.75ML SOAJ Inject 1.7 mg into the skin once a week. (Patient not taking: Reported on  07/05/2023) 3 mL 0 Not Taking   Semaglutide -Weight Management (WEGOVY ) 2.4 MG/0.75ML SOAJ Inject 2.4 mg into the skin every 7 (seven) days. (Patient not taking: Reported on 07/05/2023) 2 mL 2 Not Taking   Scheduled:   fluticasone   2 spray Each Nare Daily   insulin  aspart  0-15 Units Subcutaneous TID WC   oxybutynin   10 mg Oral QHS   Infusions:   heparin  850 Units/hr (07/06/23 1639)    Assessment: 63yo female c/o worsening SOB despite antiviral tx for Covid, EMS reports O2 89-90% on RA and was at 93-94% on 4L, found at [facility] ED to have BLL PE w/ RHS >> tPA was given and pt tx'd to [facility] MICU for further treatment and now on IV heparin . Doppler noted with bilateral DVT  -heparin  level at goal on 850 units/hr, CBC stable -cost of apixaban /rivaroxaban= $20 per month   Goal of Therapy:  Heparin  level  0.3-0.7 units/ml Monitor platelets by anticoagulation protocol: Yes   Plan:  -Continue heparin  at 850 units/hr -Daily heparin  level and CBC -Will follow oral anticoagulation plans  Prentice Poisson, PharmD Clinical Pharmacist **Pharmacist phone directory can now be found on [email or web address] ([email or web address]).  Listed under [facility] Pharm[facility].

## 2023-07-07 NOTE — Sedation Documentation (Signed)
 PA pressures @ 53/22 (34) per MD Suttle.

## 2023-07-07 NOTE — Progress Notes (Signed)
 NAME:  Dana Donovan, MRN:  [id number], DOB:  Feb 08, 1961, LOS: 3 ADMISSION DATE:  07/04/2023, CONSULTATION DATE:  1/4 REFERRING MD:  Angelena, CHIEF COMPLAINT:  submassive PE    History of Present Illness:  63 year old female w/ h/o obesity, HTN, OSA,  Recent (+) COVID (Jun 30 2023). Treated w/ paxlovid  and Phenergan  for nausea. Brought in to ER on   w/ cc: increased shortness of breath, fatigue and then a syncopal episode on Jan 4 while sitting up in chair. EMS arrival sats 89-90% room air. Initial HR 140s, BP 133/88, sats 94% on 4 lpm.   Initial lab findings: d dimer > 20,  Na 131, CO2 20, cr 1.57, Trop I 174 up to  201, lactate > 4, BNP > 2000 CT angio saddle PE w/ large clot burden and RV/LV ration of 3 PESI score: 103  dexamethasone at a dose of 6 mg daily for 10 days  Pertinent  Medical History  Obesity (on Wegovy ), HTN, OSA Significant Hospital Events: Including procedures, antibiotic start and stop dates in addition to other pertinent events   Admitted s/p recent dx COVID 12/31. Treated Paxlovid . Comes in w/ submassive PE s/p syncopal episode. 1/2 dose Alteplase  administered in ER.  1/5 weaning O2. Hep gtt   Interim History / Subjective:  Patient continued to remain short of breath Still requiring 2 L nasal cannula oxygen Repeat CTA showed persistent large volume blood clots in bilateral central pulmonary arteries Lactate is still slightly elevated 2.5  Objective   Blood pressure (!) 155/106, pulse (!) 105, temperature 97.6 F (36.4 C), temperature source Oral, resp. rate 20, height 5' (1.524 m), weight 90 kg, SpO2 90%.        Intake/Output Summary (Last 24 hours) at 07/07/2023 1524 Last data filed at 07/07/2023 1043 Gross per 24 hour  Intake 856.5 ml  Output 700 ml  Net 156.5 ml   Filed Weights   07/05/23 0200 07/06/23 0331 07/07/23 0500  Weight: 90.3 kg 92.2 kg 90 kg    Examination: General: Middle aged female, lying on the bed HEENT: [place]/AT, eyes anicteric.   moist mucus membranes Neuro: Alert, awake following commands Chest: Coarse breath sounds, no wheezes or rhonchi Heart: Regular rate and rhythm, no murmurs or gallops Abdomen: Soft, nontender, nondistended, bowel sounds present Skin: No rash   Resolved Hospital Problem list     Assessment & Plan:  Acute hypoxic resp failure with hypoxia Massive PE with cor pulmonale s/p 1/2 dose lytics -- provoked 2/2 COVID infection Persistent shortness of breath and large-volume central pulmonary clots COVID infection (dx 12/31) s/p paxlovid  Demand cardiac ischemia due to massive PE Acute right lower extremity DVT involving right femoral vein, right profunda, right popliteal, right peroneal, right posterior tibial and right gastrocnemius veins Patient received half dose tPA few days ago Echocardiogram showed normal LV function but dilated RV with reduced function Repeat CTA PE protocol showed persistent central large volume clot Spoke with IR, patient will go with catheter guided intervention Will continue with heparin  infusion afterwards  AKI, improving   Lactic acid doses Hyponatremia  Serum creatinine continue to improve Monitor intake and output Avoid nephrotoxic agent Patient with persistent lactate >2 likely due to RV dysfunction  Obesity Diet and exercise counseling provided  Transferred back to ICU post IR catheter guided intervention  Best Practice (right click and Reselect all SmartList Selections daily)   Diet/type: NPO for procedure DVT prophylaxis systemic heparin  Pressure ulcer(s): N/A GI prophylaxis: N/A Lines: N/A  Foley:  N/A Code Status:  full code Last date of multidisciplinary goals of care discussion [full code]  Labs   CBC: Recent Labs  Lab 07/04/23 1937 07/05/23 0125 07/06/23 0212 07/07/23 0229  WBC 13.0* 14.9* 13.4* 14.2*  HGB 13.1 13.1 10.8* 11.4*  HCT 40.4 40.2 33.2* 34.6*  MCV 85.6 84.3 83.8 83.0  PLT 131* 114* 153 187    Basic Metabolic  Panel: Recent Labs  Lab 07/04/23 1937 07/05/23 0416 07/07/23 0229  NA 131* 136 135  K 3.6 3.8 4.0  CL 96* 102 102  CO2 20* 19* 23  GLUCOSE 235* 114* 125*  BUN 30* 28* 27*  CREATININE 1.57* 1.33* 1.11*  CALCIUM 8.7* 8.4* 8.7*   GFR: Estimated Creatinine Clearance (by C-G formula based on SCr of 1.11 mg/dL (H)) Female: 48.1 mL/min (A) Female: 63.6 mL/min (A) Recent Labs  Lab 07/04/23 1937 07/04/23 2022 07/05/23 0125 07/05/23 0416 07/06/23 0212 07/07/23 0229 07/07/23 1047  PROCALCITON  --  <0.10  --   --   --   --   --   WBC 13.0*  --  14.9*  --  13.4* 14.2*  --   LATICACIDVEN  --  4.2* 4.3* 3.5*  --   --  2.2*    Liver Function Tests: Recent Labs  Lab 07/04/23 2022  AST 29  ALT 24  ALKPHOS 78  BILITOT 0.6  PROT 7.6  ALBUMIN 3.6   No results for input(s): LIPASE, AMYLASE in the last 168 hours. No results for input(s): AMMONIA in the last 168 hours.  ABG No results found for: PHART, PCO2ART, PO2ART, HCO3, TCO2, ACIDBASEDEF, O2SAT   Coagulation Profile: Recent Labs  Lab 07/04/23 2022 07/05/23 0125  INR 1.1 1.3*    Cardiac Enzymes: No results for input(s): CKTOTAL, CKMB, CKMBINDEX, TROPONINI in the last 168 hours.  HbA1C: Hgb A1c MFr Bld  Date/Time Value Ref Range Status  07/05/2023 01:25 AM 5.9 (H) 4.8 - 5.6 % Final    Comment:    (NOTE)         Prediabetes: 5.7 - 6.4         Diabetes: >6.4         Glycemic control for adults with diabetes: <7.0     CBG: Recent Labs  Lab 07/06/23 1103 07/06/23 1703 07/06/23 2044 07/07/23 0612 07/07/23 1142  GLUCAP 109* 87 80 120* 93    The patient is critically ill due to acute respiratory failure/massive PE critical care was necessary to treat or prevent imminent or life-threatening deterioration.  Critical care was time spent personally by me on the following activities: development of treatment plan with patient and/or surrogate as well as nursing, discussions with  consultants, evaluation of patient's response to treatment, examination of patient, obtaining history from patient or surrogate, ordering and performing treatments and interventions, ordering and review of laboratory studies, ordering and review of radiographic studies, pulse oximetry, re-evaluation of patient's condition and participation in multidisciplinary rounds.   During this encounter critical care time was devoted to patient care services described in this note for 37 minutes.     Valinda Novas, MD [facility] See Amion for pager If no response to pager, please call [phone] until 7pm After 7pm, Please call E-link [phone]

## 2023-07-07 NOTE — Sedation Documentation (Signed)
ACT 106

## 2023-07-07 NOTE — Progress Notes (Addendum)
 PROGRESS NOTE    Dana Donovan  FMW:[id number] DOB: March 17, 1961 DOA: 07/04/2023 PCP: Elliot Charm, MD  62/F with obesity, hypertension, OSA was recently diagnosed with COVID on 12/31, treated with Paxlovid  and Phenergan , presented to the ED 1/4 with syncope shortness of breath and fatigue.  In the ED D-dimer was> 20, troponin 174, 201, lactate> 4, CTA with saddle pulmonary embolism and large bilateral PE, with high clot burden. -Admitted to the ICU, treated with half dose alteplase  and started on heparin  drip TX from PCCM to [facility] service 1/6 ECHO w/ Severe RV failure Dopplers with extensive B/L DVTs  Subjective: -some intermittent dyspnea  Assessment and Plan:  Acute hypoxic respiratory failure Acute saddle PE, massive pulmonary emboli, ext DVTs -Likely provoked by COVID infection, obesity -Has also been on Wegovy  for 3 months -Treated with half dose tPA on admission -Now on heparin  drip -2D echo with severely reduced RV function, requested PCCM re-eval today, repeat CTA chest abd pelvis, check lactate -Extensive B/L DVTs noted on dopplers yesterday will request Vascular eval as well  Recent COVID 12/31 -With PE as above -No evidence of COVID-pneumonia on CTA  AKI Hyponatremia -Improving  Obesity -BMI is 39, started on Wegovy  3 months ago  Hyperglycemia -SSI, HbA1c   History of hypertension -Antihypertensives on hold  DVT prophylaxis: IV heparin  Code Status: Full code Family Communication: Spouse at bedside Disposition Plan: Home when stable  Consultants:    Procedures:   Antimicrobials:    Objective: Vitals:   07/07/23 0015 07/07/23 0447 07/07/23 0500 07/07/23 0725  BP: 133/84 (!) 153/92  139/82  Pulse: 99 99 97 97  Resp: 20 16 18 19   Temp: 97.6 F (36.4 C) 97.6 F (36.4 C)  97.6 F (36.4 C)  TempSrc: Oral Oral  Oral  SpO2: 94% 96% 96% 98%  Weight:   90 kg   Height:        Intake/Output Summary (Last 24 hours) at 07/07/2023  1014 Last data filed at 07/07/2023 0600 Gross per 24 hour  Intake 974.5 ml  Output --  Net 974.5 ml   Filed Weights   07/05/23 0200 07/06/23 0331 07/07/23 0500  Weight: 90.3 kg 92.2 kg 90 kg    Examination:  General exam: Appears calm and comfortable  HEENT: Pos JVD Respiratory system: CLear Cardiovascular system: S1 & S2 heard, RRR.  Abd: nondistended, soft and nontender.Normal bowel sounds heard. Central nervous system: Alert and oriented. No focal neurological deficits. Extremities: trace edema Skin: No rashes Psychiatry:  Mood & affect appropriate.     Data Reviewed:   CBC: Recent Labs  Lab 07/04/23 1937 07/05/23 0125 07/06/23 0212 07/07/23 0229  WBC 13.0* 14.9* 13.4* 14.2*  HGB 13.1 13.1 10.8* 11.4*  HCT 40.4 40.2 33.2* 34.6*  MCV 85.6 84.3 83.8 83.0  PLT 131* 114* 153 187   Basic Metabolic Panel: Recent Labs  Lab 07/04/23 1937 07/05/23 0416 07/07/23 0229  NA 131* 136 135  K 3.6 3.8 4.0  CL 96* 102 102  CO2 20* 19* 23  GLUCOSE 235* 114* 125*  BUN 30* 28* 27*  CREATININE 1.57* 1.33* 1.11*  CALCIUM 8.7* 8.4* 8.7*   GFR: Estimated Creatinine Clearance (by C-G formula based on SCr of 1.11 mg/dL (H)) Female: 48.1 mL/min (A) Female: 63.6 mL/min (A) Liver Function Tests: Recent Labs  Lab 07/04/23 2022  AST 29  ALT 24  ALKPHOS 78  BILITOT 0.6  PROT 7.6  ALBUMIN 3.6   No results for input(s): LIPASE, AMYLASE in the  last 168 hours. No results for input(s): AMMONIA in the last 168 hours. Coagulation Profile: Recent Labs  Lab 07/04/23 2022 07/05/23 0125  INR 1.1 1.3*   Cardiac Enzymes: No results for input(s): CKTOTAL, CKMB, CKMBINDEX, TROPONINI in the last 168 hours. BNP (last 3 results) No results for input(s): PROBNP in the last 8760 hours. HbA1C: Recent Labs    07/05/23 0125  HGBA1C 5.9*   CBG: Recent Labs  Lab 07/06/23 0621 07/06/23 1103 07/06/23 1703 07/06/23 2044 07/07/23 0612  GLUCAP 138* 109* 87 80 120*    Lipid Profile: No results for input(s): CHOL, HDL, LDLCALC, TRIG, CHOLHDL, LDLDIRECT in the last 72 hours. Thyroid Function Tests: No results for input(s): TSH, T4TOTAL, FREET4, T3FREE, THYROIDAB in the last 72 hours. Anemia Panel: Recent Labs    07/06/23 0850  FERRITIN 214   Urine analysis: No results found for: COLORURINE, APPEARANCEUR, LABSPEC, PHURINE, GLUCOSEU, HGBUR, BILIRUBINUR, KETONESUR, PROTEINUR, UROBILINOGEN, NITRITE, LEUKOCYTESUR Sepsis Labs: @LABRCNTIP (procalcitonin:4,lacticidven:4)  ) Recent Results (from the past 240 hours)  Blood culture (routine x 2)     Status: None (Preliminary result)   Collection Time: 07/04/23  8:22 PM   Specimen: BLOOD  Result Value Ref Range Status   Specimen Description BLOOD RIGHT AC  Final   Special Requests   Final    BOTTLES DRAWN AEROBIC AND ANAEROBIC Blood Culture results may not be optimal due to an inadequate volume of blood received in culture bottles   Culture   Final    NO GROWTH 3 DAYS Performed at [facility], [address] [place], [place] [address]    Report Status PENDING  Incomplete  Blood culture (routine x 2)     Status: None (Preliminary result)   Collection Time: 07/04/23  8:22 PM   Specimen: BLOOD  Result Value Ref Range Status   Specimen Description BLOOD RIGHT HAND  Final   Special Requests   Final    BOTTLES DRAWN AEROBIC AND ANAEROBIC Blood Culture results may not be optimal due to an inadequate volume of blood received in culture bottles   Culture   Final    NO GROWTH 3 DAYS Performed at [facility], [address], [place], [place] [address]    Report Status PENDING  Incomplete  Resp panel by RT-PCR (RSV, Flu A&B, Covid) Anterior Nasal Swab     Status: Abnormal   Collection Time: 07/04/23  8:22 PM   Specimen: Anterior Nasal Swab  Result Value Ref Range Status   SARS Coronavirus 2 by RT PCR POSITIVE (A) NEGATIVE Final    Comment:  (NOTE) SARS-CoV-2 target nucleic acids are DETECTED.  The SARS-CoV-2 RNA is generally detectable in upper respiratory specimens during the acute phase of infection. Positive results are indicative of the presence of the identified virus, but do not rule out bacterial infection or co-infection with other pathogens not detected by the test. Clinical correlation with patient history and other diagnostic information is necessary to determine patient infection status. The expected result is Negative.  Fact Sheet for Patients: bloggercourse.com  Fact Sheet for Healthcare Providers: seriousbroker.it  This test is not yet approved or cleared by the [place]  FDA and  has been authorized for detection and/or diagnosis of SARS-CoV-2 by FDA under an Emergency Use Authorization (EUA).  This EUA will remain in effect (meaning this test can be used) for the duration of  the COVID-19 declaration under Section 564(b)(1) of the A ct, 21 U.S.C. section 360bbb-3(b)(1), unless the authorization is terminated or  revoked sooner.     Influenza A by PCR NEGATIVE NEGATIVE Final   Influenza B by PCR NEGATIVE NEGATIVE Final    Comment: (NOTE) The Xpert Xpress SARS-CoV-2/FLU/RSV plus assay is intended as an aid in the diagnosis of influenza from Nasopharyngeal swab specimens and should not be used as a sole basis for treatment. Nasal washings and aspirates are unacceptable for Xpert Xpress SARS-CoV-2/FLU/RSV testing.  Fact Sheet for Patients: bloggercourse.com  Fact Sheet for Healthcare Providers: seriousbroker.it  This test is not yet approved or cleared by the [place]  FDA and has been authorized for detection and/or diagnosis of SARS-CoV-2 by FDA under an Emergency Use Authorization (EUA). This EUA will remain in effect (meaning this test can be used) for the duration of the COVID-19  declaration under Section 564(b)(1) of the Act, 21 U.S.C. section 360bbb-3(b)(1), unless the authorization is terminated or revoked.     Resp Syncytial Virus by PCR NEGATIVE NEGATIVE Final    Comment: (NOTE) Fact Sheet for Patients: bloggercourse.com  Fact Sheet for Healthcare Providers: seriousbroker.it  This test is not yet approved or cleared by the [place]  FDA and has been authorized for detection and/or diagnosis of SARS-CoV-2 by FDA under an Emergency Use Authorization (EUA). This EUA will remain in effect (meaning this test can be used) for the duration of the COVID-19 declaration under Section 564(b)(1) of the Act, 21 U.S.C. section 360bbb-3(b)(1), unless the authorization is terminated or revoked.  Performed at [facility], [address] [place], [place] [address]   MRSA Next Gen by PCR, Nasal     Status: None   Collection Time: 07/05/23 12:24 AM   Specimen: Nasal Mucosa; Nasal Swab  Result Value Ref Range Status   MRSA by PCR Next Gen NOT DETECTED NOT DETECTED Final    Comment: (NOTE) The GeneXpert MRSA Assay (FDA approved for NASAL specimens only), is one component of a comprehensive MRSA colonization surveillance program. It is not intended to diagnose MRSA infection nor to guide or monitor treatment for MRSA infections. Test performance is not FDA approved in patients less than 60 years old. Performed at [facility], [address] [place], [place] [address]      Radiology Studies: ECHOCARDIOGRAM COMPLETE Result Date: 07/06/2023    ECHOCARDIOGRAM REPORT   Patient Name:   Dana Donovan Date of Exam: 07/06/2023 Medical Rec #:  [id number]            Height:       60.0 in Accession #:    [id number]           Weight:       203.3 lb Date of Birth:  [birth year]           BSA:          1.879 m Patient Age:    62 years             BP:           153/86 mmHg Patient Gender: F                     HR:           102 bpm. Exam Location:  Inpatient Procedure: 2D Echo, Cardiac Doppler, Color Doppler and Intracardiac            Opacification Agent Indications:    Pulmonary Embolus I26.09  History:        Patient has no prior history of Echocardiogram examinations.  Risk Factors:Hypertension.  Sonographer:    Thea Norlander RCS Referring Phys: [phone] PETER E BABCOCK IMPRESSIONS  1. Left ventricular ejection fraction, by estimation, is >75%. The left ventricle has hyperdynamic function. The left ventricle has no regional wall motion abnormalities. Left ventricular diastolic parameters are consistent with Grade I diastolic dysfunction (impaired relaxation).  2. Right ventricular systolic function is severely reduced. The right ventricular size is moderately enlarged. There is mildly elevated pulmonary artery systolic pressure. The estimated right ventricular systolic pressure is 43.5 mmHg.  3. Right atrial size was mildly dilated.  4. The mitral valve is normal in structure. No evidence of mitral valve regurgitation.  5. The aortic valve is tricuspid. Aortic valve regurgitation is not visualized.  6. The inferior vena cava is dilated in size with <50% respiratory variability, suggesting right atrial pressure of 15 mmHg. Comparison(s): No prior Echocardiogram. Conclusion(s)/Recommendation(s): Findings suggestive of significant RV strain in the setting of pulmonary embolus. FINDINGS  Left Ventricle: Left ventricular ejection fraction, by estimation, is >75%. The left ventricle has hyperdynamic function. The left ventricle has no regional wall motion abnormalities. Definity  contrast agent was given IV to delineate the left ventricular endocardial borders. The left ventricular internal cavity size was normal in size. There is no left ventricular hypertrophy. Left ventricular diastolic parameters are consistent with Grade I diastolic dysfunction (impaired relaxation). Indeterminate filling pressures. Right  Ventricle: The right ventricular size is moderately enlarged. No increase in right ventricular wall thickness. Right ventricular systolic function is severely reduced. There is mildly elevated pulmonary artery systolic pressure. The tricuspid regurgitant velocity is 2.67 m/s, and with an assumed right atrial pressure of 15 mmHg, the estimated right ventricular systolic pressure is 43.5 mmHg. Left Atrium: Left atrial size was normal in size. Right Atrium: Right atrial size was mildly dilated. Pericardium: There is no evidence of pericardial effusion. Mitral Valve: The mitral valve is normal in structure. No evidence of mitral valve regurgitation. Tricuspid Valve: The tricuspid valve is grossly normal. Tricuspid valve regurgitation is trivial. Aortic Valve: The aortic valve is tricuspid. Aortic valve regurgitation is not visualized. Aortic valve peak gradient measures 9.9 mmHg. Pulmonic Valve: The pulmonic valve was normal in structure. Pulmonic valve regurgitation is not visualized. Aorta: The aortic root and ascending aorta are structurally normal, with no evidence of dilitation. Venous: The inferior vena cava is dilated in size with less than 50% respiratory variability, suggesting right atrial pressure of 15 mmHg. IAS/Shunts: No atrial level shunt detected by color flow Doppler.  LEFT VENTRICLE PLAX 2D LVIDd:         3.80 cm   Diastology LVIDs:         1.50 cm   LV e' medial:    8.27 cm/s LV PW:         1.00 cm   LV E/e' medial:  10.7 LV IVS:        0.90 cm   LV e' lateral:   11.50 cm/s LVOT diam:     1.90 cm   LV E/e' lateral: 7.7 LV SV:         43 LV SV Index:   23 LVOT Area:     2.84 cm  RIGHT VENTRICLE             IVC RV S prime:     16.70 cm/s  IVC diam: 2.10 cm TAPSE (M-mode): 1.8 cm LEFT ATRIUM           Index        RIGHT  ATRIUM           Index LA diam:      2.60 cm 1.38 cm/m   RA Area:     17.10 cm LA Vol (A2C): 28.5 ml 15.16 ml/m  RA Volume:   47.10 ml  25.06 ml/m LA Vol (A4C): 25.7 ml 13.67 ml/m   AORTIC VALVE AV Area (Vmax): 1.77 cm AV Vmax:        157.00 cm/s AV Peak Grad:   9.9 mmHg LVOT Vmax:      97.82 cm/s LVOT Vmean:     62.575 cm/s LVOT VTI:       0.153 m  AORTA Ao Root diam: 2.50 cm Ao Asc diam:  3.10 cm MITRAL VALVE                TRICUSPID VALVE MV Area (PHT): 5.31 cm     TR Peak grad:   28.5 mmHg MV Decel Time: 143 msec     TR Vmax:        267.00 cm/s MV E velocity: 88.10 cm/s MV A velocity: 128.00 cm/s  SHUNTS MV E/A ratio:  0.69         Systemic VTI:  0.15 m                             Systemic Diam: 1.90 cm Vinie Maxcy MD Electronically signed by Vinie Maxcy MD Signature Date/Time: 07/06/2023/4:39:02 PM    Final    VAS US  IVC/ILIAC (VENOUS ONLY) Result Date: 07/06/2023 IVC/ILIAC STUDY Patient Name:  Dana Donovan  Date of Exam:   07/06/2023 Medical Rec #: [id number]             Accession #:    [id number] Date of Birth: 19-Mar-1961            Patient Gender: F Patient Age:   28 years Exam Location:  [facility] Procedure:      VAS US  IVC/ILIAC (VENOUS ONLY) Referring Phys: --------------------------------------------------------------------------------  Indications: Extensive DVT and massive PE Other Factors: Covid 19 infection.  Comparison Study: No prior study on file Performing Technologist: Alberta Lis RVS  Examination Guidelines: A complete evaluation includes B-mode imaging, spectral Doppler, color Doppler, and power Doppler as needed of all accessible portions of each vessel. Bilateral testing is considered an integral part of a complete examination. Limited examinations for reoccurring indications may be performed as noted.  IVC/Iliac Findings: +----------+------+--------+----------------+    IVC    PatentThrombus    Comments     +----------+------+--------+----------------+ IVC Prox         acute  partial thrombus +----------+------+--------+----------------+ IVC Mid          acute  partial thrombus +----------+------+--------+----------------+ IVC  Distal       acute  partial thrombus +----------+------+--------+----------------+  +------------------+---------+-----------+---------+-----------+---------------+        CIV        RT-PatentRT-ThrombusLT-PatentLT-Thrombus   Comments     +------------------+---------+-----------+---------+-----------+---------------+ Common Iliac Prox  patent                         acute       partial  thrombus     +------------------+---------+-----------+---------+-----------+---------------+ Common Iliac Mid   patent                         acute       partial                                                                  thrombus     +------------------+---------+-----------+---------+-----------+---------------+ Common Iliac       patent                         acute       partial     Distal                                                       thrombus     +------------------+---------+-----------+---------+-----------+---------------+  +-----------------+---------+-----------+---------+-----------+----------------+        EIV       RT-PatentRT-ThrombusLT-PatentLT-Thrombus    Comments     +-----------------+---------+-----------+---------+-----------+----------------+ External Iliac    patent                         acute   partial thrombus Vein Prox                                                                 +-----------------+---------+-----------+---------+-----------+----------------+ External Iliac    patent                         acute   partial thrombus Vein Mid                                                                  +-----------------+---------+-----------+---------+-----------+----------------+ External Iliac    patent                         acute   partial thrombus Vein Distal                                                                +-----------------+---------+-----------+---------+-----------+----------------+  Summary: IVC/Iliac: Partial acute thrombus noted throughout the left external and common iliac veins and the IVC.  *See table(s) above for measurements and observations.  Electronically signed by Lonni Gaskins MD on 07/06/2023 at 1:51:45 PM.    Final    VAS US   LOWER EXTREMITY VENOUS (DVT) Result Date: 07/06/2023  Lower Venous DVT Study Patient Name:  Dana Donovan  Date of Exam:   07/06/2023 Medical Rec #: [id number]             Accession #:    [id number] Date of Birth: [birth year]            Patient Gender: F Patient Age:   48 years Exam Location:  [facility] Procedure:      VAS US  LOWER EXTREMITY VENOUS (DVT) Referring Phys: DORN CHILL --------------------------------------------------------------------------------  Indications: Massive Pulmonary embolism, SOB, and Covid 19 infection.  Anticoagulation: Heparin . Comparison Study: No prior study on file Performing Technologist: Alberta Lis RVS  Examination Guidelines: A complete evaluation includes B-mode imaging, spectral Doppler, color Doppler, and power Doppler as needed of all accessible portions of each vessel. Bilateral testing is considered an integral part of a complete examination. Limited examinations for reoccurring indications may be performed as noted. The reflux portion of the exam is performed with the patient in reverse Trendelenburg.  +---------+---------------+---------+-----------+---------------+-------------+ RIGHT    CompressibilityPhasicitySpontaneityProperties     Thrombus                                                                 Aging         +---------+---------------+---------+-----------+---------------+-------------+ CFV      Full           Yes      No         pulsatile                                                                waveform                      +---------+---------------+---------+-----------+---------------+-------------+ SFJ      Full                                                            +---------+---------------+---------+-----------+---------------+-------------+ FV Prox  Partial                                           Acute         +---------+---------------+---------+-----------+---------------+-------------+ FV Mid   Partial        No       No                        Acute         +---------+---------------+---------+-----------+---------------+-------------+ FV DistalPartial        Yes      No         pulsatile      Acute  waveform                     +---------+---------------+---------+-----------+---------------+-------------+ PFV      Partial                                           Acute         +---------+---------------+---------+-----------+---------------+-------------+ POP      Partial        No       No                        Acute         +---------+---------------+---------+-----------+---------------+-------------+ PTV      None                                              Acute         +---------+---------------+---------+-----------+---------------+-------------+ PERO     None                                              Acute         +---------+---------------+---------+-----------+---------------+-------------+ Gastroc  Partial        Yes      No         pulsatile      Acute                                                     waveform                     +---------+---------------+---------+-----------+---------------+-------------+   +---------+---------------+---------+-----------+---------------+-------------+ LEFT     CompressibilityPhasicitySpontaneityProperties     Thrombus                                                                 Aging          +---------+---------------+---------+-----------+---------------+-------------+ CFV      Partial        Yes      No         pulsatile      Acute                                                     waveform                     +---------+---------------+---------+-----------+---------------+-------------+ SFJ      Partial  Acute         +---------+---------------+---------+-----------+---------------+-------------+ FV Prox  Partial        No       No                        Acute         +---------+---------------+---------+-----------+---------------+-------------+ FV Mid   None           No       No                        Acute         +---------+---------------+---------+-----------+---------------+-------------+ FV DistalNone           No       No                        Acute         +---------+---------------+---------+-----------+---------------+-------------+ PFV      Full           Yes      No         pulsatile      Acute                                                     waveform                     +---------+---------------+---------+-----------+---------------+-------------+ POP      None           No       No                        Acute         +---------+---------------+---------+-----------+---------------+-------------+ PTV      None                                              Acute         +---------+---------------+---------+-----------+---------------+-------------+ PERO     None                                              Acute         +---------+---------------+---------+-----------+---------------+-------------+ Gastroc  None           No       No                        Acute         +---------+---------------+---------+-----------+---------------+-------------+     Summary: RIGHT: - Findings consistent with acute deep vein thrombosis involving the right femoral vein,  right proximal profunda vein, right popliteal vein, right posterior tibial veins, and right peroneal veins. Findings consistent with acute intramuscular thrombosis involving the right gastrocnemius veins. - No cystic structure found in the popliteal fossa. pulsatile waveforms noted  LEFT: - Findings consistent with acute deep vein thrombosis involving the left common femoral vein, SF junction, left femoral vein, left popliteal  vein, left posterior tibial veins, and left peroneal veins. Findings consistent with acute intramuscular thrombosis involving the left gastrocnemius veins. - No cystic structure found in the popliteal fossa. Pulsatile waveforms noted.  *See table(s) above for measurements and observations. Electronically signed by Lonni Gaskins MD on 07/06/2023 at 1:51:07 PM.    Final      Scheduled Meds:  fluticasone   2 spray Each Nare Daily   insulin  aspart  0-15 Units Subcutaneous TID WC   oxybutynin   10 mg Oral QHS   Continuous Infusions:  heparin  850 Units/hr (07/06/23 1639)     LOS: 3 days    Time spent:    Sigurd Pac, MD [facility]   07/07/2023, 10:14 AM

## 2023-07-07 NOTE — Plan of Care (Signed)
  Problem: Coping: Goal: Psychosocial and spiritual needs will be supported Outcome: Progressing   Problem: Education: Goal: Knowledge of General Education information will improve Description: Including pain rating scale, medication(s)/side effects and non-pharmacologic comfort measures Outcome: Progressing   Problem: Clinical Measurements: Goal: Cardiovascular complication will be avoided Outcome: Progressing   Problem: Nutrition: Goal: Adequate nutrition will be maintained Outcome: Progressing   Problem: Coping: Goal: Level of anxiety will decrease Outcome: Progressing

## 2023-07-07 NOTE — Progress Notes (Signed)
 eLink Physician-Brief Progress Note Patient Name: Dana Donovan DOB: Sep 13, 1960 MRN: [id number]   Date of Service  07/07/2023  HPI/Events of Note  Return to the ICU today for refractory hypoxemia and aspiration of large clot burden via IR.  Now hypertensive.  Has AKI and RV dysfunction in the setting of large clot.  eICU Interventions  Add hydralazine  as needed for hypertension     Intervention Category Intermediate Interventions: Hypertension - evaluation and management  Agamjot Kilgallon 07/07/2023, 9:28 PM

## 2023-07-07 NOTE — Plan of Care (Signed)
  Problem: Education: Goal: Knowledge of risk factors and measures for prevention of condition will improve Outcome: Progressing   Problem: Coping: Goal: Psychosocial and spiritual needs will be supported Outcome: Progressing   Problem: Respiratory: Goal: Will maintain a patent airway Outcome: Progressing Goal: Complications related to the disease process, condition or treatment will be avoided or minimized Outcome: Progressing   Problem: Education: Goal: Knowledge of General Education information will improve Description: Including pain rating scale, medication(s)/side effects and non-pharmacologic comfort measures Outcome: Progressing   Problem: Health Behavior/Discharge Planning: Goal: Ability to manage health-related needs will improve Outcome: Progressing   Problem: Clinical Measurements: Goal: Ability to maintain clinical measurements within normal limits will improve Outcome: Progressing Goal: Will remain free from infection Outcome: Progressing Goal: Diagnostic test results will improve Outcome: Progressing Goal: Respiratory complications will improve Outcome: Progressing Goal: Cardiovascular complication will be avoided Outcome: Progressing   Problem: Activity: Goal: Risk for activity intolerance will decrease Outcome: Progressing   Problem: Nutrition: Goal: Adequate nutrition will be maintained Outcome: Progressing   Problem: Coping: Goal: Level of anxiety will decrease Outcome: Progressing   Problem: Elimination: Goal: Will not experience complications related to bowel motility Outcome: Progressing Goal: Will not experience complications related to urinary retention Outcome: Progressing   Problem: Pain Management: Goal: General experience of comfort will improve Outcome: Progressing   Problem: Safety: Goal: Ability to remain free from injury will improve Outcome: Progressing   Problem: Skin Integrity: Goal: Risk for impaired skin integrity will  decrease Outcome: Progressing   Problem: Education: Goal: Ability to describe self-care measures that may prevent or decrease complications (Diabetes Survival Skills Education) will improve Outcome: Progressing Goal: Individualized Educational Video(s) Outcome: Progressing   Problem: Coping: Goal: Ability to adjust to condition or change in health will improve Outcome: Progressing   Problem: Fluid Volume: Goal: Ability to maintain a balanced intake and output will improve Outcome: Progressing   Problem: Health Behavior/Discharge Planning: Goal: Ability to identify and utilize available resources and services will improve Outcome: Progressing Goal: Ability to manage health-related needs will improve Outcome: Progressing   Problem: Metabolic: Goal: Ability to maintain appropriate glucose levels will improve Outcome: Progressing   Problem: Nutritional: Goal: Maintenance of adequate nutrition will improve Outcome: Progressing Goal: Progress toward achieving an optimal weight will improve Outcome: Progressing   Problem: Skin Integrity: Goal: Risk for impaired skin integrity will decrease Outcome: Progressing   Problem: Tissue Perfusion: Goal: Adequacy of tissue perfusion will improve Outcome: Progressing

## 2023-07-07 NOTE — Sedation Documentation (Signed)
 ACT @ 204. No heparin bolus at this time per MD Suttle.

## 2023-07-08 DIAGNOSIS — I2699 Other pulmonary embolism without acute cor pulmonale: Secondary | ICD-10-CM | POA: Diagnosis not present

## 2023-07-08 LAB — CBC
HCT: 35.7 % — ABNORMAL LOW (ref 36.0–46.0)
Hemoglobin: 11.6 g/dL — ABNORMAL LOW (ref 12.0–15.0)
MCH: 27.6 pg (ref 26.0–34.0)
MCHC: 32.5 g/dL (ref 30.0–36.0)
MCV: 85 fL (ref 80.0–100.0)
Platelets: 181 10*3/uL (ref 150–400)
RBC: 4.2 MIL/uL (ref 3.87–5.11)
RDW: 14.5 % (ref 11.5–15.5)
WBC: 13.2 10*3/uL — ABNORMAL HIGH (ref 4.0–10.5)
nRBC: 0 % (ref 0.0–0.2)

## 2023-07-08 LAB — GLUCOSE, CAPILLARY
Glucose-Capillary: 74 mg/dL (ref 70–99)
Glucose-Capillary: 78 mg/dL (ref 70–99)
Glucose-Capillary: 86 mg/dL (ref 70–99)

## 2023-07-08 LAB — HEPARIN LEVEL (UNFRACTIONATED): Heparin Unfractionated: 0.53 [IU]/mL (ref 0.30–0.70)

## 2023-07-08 MED ORDER — ORAL CARE MOUTH RINSE: 15.0000 mL | OROMUCOSAL | Status: DC | PRN

## 2023-07-08 MED ORDER — TRIAMTERENE-HCTZ 75-50 MG PO TABS
1.0000 | ORAL_TABLET | Freq: Every day | ORAL | Status: DC
  Administered 2023-07-08 – 2023-07-10 (×3): 1 via ORAL
  Filled 2023-07-08 (×3): qty 1

## 2023-07-08 MED ORDER — ACETAMINOPHEN 500 MG PO TABS
1000.0000 mg | ORAL_TABLET | Freq: Four times a day (QID) | ORAL | Status: DC | PRN
  Administered 2023-07-08 – 2023-07-09 (×3): 1000 mg via ORAL
  Filled 2023-07-08 (×3): qty 2

## 2023-07-08 NOTE — Progress Notes (Signed)
 PHARMACY - ANTICOAGULATION CONSULT NOTE  Pharmacy Consult for heparin  Indication: pulmonary embolus  No Known Allergies  Patient Measurements: Height: 5' (152.4 cm) Weight: 93.7 kg (206 lb 9.1 oz) IBW/kg (Calculated) : 45.5 Heparin  Dosing Weight: 70kg  Vital Signs: Temp: 98.4 F (36.9 C) (01/08 0810) Temp Source: Oral (01/08 0810) BP: 168/87 (01/08 0700) Pulse Rate: 63 (01/08 0700)  Labs: Recent Labs    07/06/23 0212 07/07/23 0229 07/08/23 0233  HGB 10.8* 11.4* 11.6*  HCT 33.2* 34.6* 35.7*  PLT 153 187 181  HEPARINUNFRC 0.39 0.35 0.53  CREATININE  --  1.11*  --     Estimated Creatinine Clearance (by C-G formula based on SCr of 1.11 mg/dL (H)) Female: 46.8 mL/min (A) Female: 65 mL/min (A)   Assessment: 34 YOF c/o worsening SOB despite antiviral tx for Covid, EMS reports O2 89-90% on RA and was at 93-94% on 4L, found at [facility] ED to have BLL PE w/ RHS >> tPA was given and pt tx'd to [facility] MICU for further treatment and now on IV heparin . Doppler noted with bilateral DVT  Heparin  level therapeutic; CBC stable; no bleeding reported.  Goal of Therapy:  Heparin  level 0.3-0.7 units/ml Monitor platelets by anticoagulation protocol: Yes   Plan:  Continue heparin  gtt at 850 units/hr Daily heparin  level and CBC F/u VVS evaluation F/u transition to oral AC when able  Darius Fillingim D. Lendell, PharmD, BCPS, BCCCP 07/08/2023, 8:29 AM

## 2023-07-08 NOTE — Progress Notes (Signed)
 Referring Physician(s): Isaacs,Cameron  Supervising Physician: Alona Corners  Patient Status:  [facility] - In-pt  Chief Complaint: Shortness of breath  Subjective: Patient sitting up in chair.  Does get quickly short of breath with tachycardia to 122 during transfer from chair to bed.  Overall feeling better and able to hold conversation.   Allergies: Patient has no known allergies.  Medications: Prior to Admission medications   Medication Sig Start Date End Date Taking? Authorizing Provider  albuterol  (PROVENTIL ) (2.5 MG/3ML) 0.083% nebulizer solution Take 2.5 mg by nebulization every 6 (six) hours as needed for shortness of breath or wheezing. 05/30/18  Yes [provider]  albuterol  (VENTOLIN  HFA) 108 (90 Base) MCG/ACT inhaler Inhale 2 puffs into the lungs every 6 (six) hours as needed for wheezing or shortness of breath.   Yes [provider]  diclofenac (VOLTAREN) 75 MG EC tablet Take 75 mg by mouth 2 (two) times daily as needed for mild pain (pain score 1-3).   Yes [provider]  DRY EYE RELIEF DROPS 0.2-0.2-1 % SOLN Place 1 drop into both eyes in the morning, at noon, and at bedtime.   Yes [provider]  fluticasone  (FLONASE ) 50 MCG/ACT nasal spray Place 2 sprays into both nostrils daily. Patient taking differently: Place 2 sprays into both nostrils daily as needed for allergies or rhinitis. 06/30/23  Yes Vivienne Delon HERO, PA-C  latanoprost (XALATAN) 0.005 % ophthalmic solution Place 1 drop into both eyes at bedtime.   Yes [provider]  lisinopril  (PRINIVIL ,ZESTRIL ) 40 MG tablet Take 40 mg by mouth daily. 06/22/18  Yes [provider]  oxybutynin  (DITROPAN -XL) 10 MG 24 hr tablet Take 10 mg by mouth at bedtime. 05/17/18  Yes [provider]  PRESCRIPTION MEDICATION See admin instructions. CPAP- At bedtime   Yes [provider]  promethazine -dextromethorphan (PROMETHAZINE -DM) 6.25-15 MG/5ML syrup Take  5 mLs by mouth 4 (four) times daily as needed. Patient taking differently: Take 5 mLs by mouth 4 (four) times daily as needed for cough. 06/30/23  Yes Vivienne Delon HERO, PA-C  Semaglutide -Weight Management (WEGOVY ) 2.4 MG/0.75ML SOAJ Inject 2.4 mg into the skin once a week. Patient taking differently: Inject 2.4 mg into the skin every Thursday. 05/06/23  Yes   triamterene -hydrochlorothiazide  (MAXZIDE ) 75-50 MG tablet Take 1 tablet by mouth daily.   Yes [provider]  TYLENOL  500 MG tablet Take 500-1,000 mg by mouth every 6 (six) hours as needed for mild pain (pain score 1-3), headache or fever.   Yes [provider]  COVID-19 At Home Antigen Test KIT Use as directed. Patient not taking: Reported on 07/05/2023 06/30/23   Burnette, Jennifer M, PA-C  Semaglutide -Weight Management (WEGOVY ) 1 MG/0.5ML SOAJ Inject 1 mg into the skin once a week. Patient not taking: Reported on 07/05/2023 03/05/23     Semaglutide -Weight Management (WEGOVY ) 1.7 MG/0.75ML SOAJ Inject 1.7 mg into the skin once a week. Patient not taking: Reported on 07/05/2023 04/02/23     Semaglutide -Weight Management (WEGOVY ) 2.4 MG/0.75ML SOAJ Inject 2.4 mg into the skin every 7 (seven) days. Patient not taking: Reported on 07/05/2023 06/17/23        Vital Signs: BP (!) 155/71   Pulse (!) 109   Temp 97.6 F (36.4 C) (Oral)   Resp 18   Ht 5' (1.524 m)   Wt 206 lb 9.1 oz (93.7 kg)   SpO2 100%   BMI 40.34 kg/m   Physical Exam NAD, alert Chest: mild increased shortness of breath,  work of breathing with movement/effort.  Stable at rest on 2L [place].  Skin: Suture intact.  No oozing or bleeding.   Imaging: IR Angiogram Pulmonary Bilateral Selective Result Date: 07/08/2023 INDICATION: 63 year old female with history of acute, intermediate-high risk pulmonary embolism and bilateral lower extremity deep vein thrombosis in the setting of COVID infection with persistent dysnpea, hypoxia, tachycardia, and right heart failure  status post 1/2 dose t-pa 3 days ago. EXAM: 1. Ultrasound guided vascular access of the right common femoral vein. 2. Bilateral pulmonary arteriography and pulmonary manometry. 3. Bilateral pulmonary artery catheter directed aspiration thrombectomy. COMPARISON:  07/04/2023, 07/07/2023 MEDICATIONS: None. ANESTHESIA/SEDATION: Moderate (conscious) sedation was employed during this procedure. A total of Versed  1.5 mg and Fentanyl  50 mcg was administered intravenously. Moderate Sedation Time: 79 minutes. The patient's level of consciousness and vital signs were monitored continuously by radiology nursing throughout the procedure under my direct supervision. FLUOROSCOPY TIME:  One hundred seventy-one mGy COMPLICATIONS: None immediate. TECHNIQUE: The procedure was performed with the assistance of a my partner, Dr. Ami Bellman. Informed written consent was obtained from the patient after a thorough discussion of the procedural risks, benefits and alternatives. All questions were addressed. Maximal Sterile Barrier Technique was utilized including caps, mask, sterile gowns, sterile gloves, sterile drape, hand hygiene and skin antiseptic. A timeout was performed prior to the initiation of the procedure. Preprocedure ultrasound evaluation demonstrated patency of the right common femoral vein. The procedure was planned. Subdermal Local anesthesia was provided the planned needle entry site with 1% lidocaine . Skin nick was made. Under direct ultrasound visualization, the right common femoral vein was accessed with a 21 gauge micropuncture needle. A permanent ultrasound image was captured and stored in the record. A micropuncture sheath was introduced through which a Wholey wire was placed and directed to the inferior vena cava under fluoroscopic guidance. Dilation with an 8 French sheath was performed followed by placement of 2 ProGlide devices at the 10 o'clock and 2 o'clock positions in pre close fashion. Next, a 24 French  sheath was placed with the tip placed in the perihepatic inferior vena cava. An angled pigtail catheter was then inserted over the wire and under fluoroscopic guidance guided to the main pulmonary artery. Pulmonary angiogram was then performed which demonstrated limited perfusion in the right greater than left pulmonary arteries. Pulmonary manometry was performed and measured at 71/30 mmHg with a mean pulmonary artery pressure of 46 mmHg. The [facility] wire was directed to the right pulmonary artery and the pigtail catheter was removed. The select catheter was unable to be advanced into the right pulmonary artery due to tortuosity. Therefore a 7 French, 70 cm Ansel sheath was inserted over the [facility] wire to the level of the main pulmonary artery. A Rosen wire was then directed into the right inferior pulmonary artery. A selective catheter was then inserted over the wire. The wire was exchanged for short taper superstiff Amplatz wire. The catheter and coaxial sheath were removed. Eight hundred twenty-four French FlowTriever aspiration catheter was inserted in the right inferior pulmonary artery and multiple aspirations were performed yielding acute appearing thrombus in the aspiration canister. The catheter was retracted into the right superior pulmonary artery an additional aspirations were performed yielding small volume acute appearing thrombus. Right pulmonary angiogram was then performed which demonstrated significantly improved perfusion to the right upper lobe. There is small volume residual thrombus in the right inferior pulmonary artery. At this point, the patient's tachycardia resolved and hypoxia improved. The aspiration catheter  was then retracted into the proximal left pulmonary artery. Aspiration thrombectomy was again performed this location yielding minimal acute appearing thrombus. Therefore, the curved 20 French aspiration catheter was inserted in coaxial fashion into the left inferior pulmonary  artery. Aspiration was repeated which yielded moderate volume acute appearing thrombus in the aspiration canister. Pulmonary manometry was then repeated measuring 48 over 18 mmHg with a mean pulmonary artery pressure of 31 mmHg. Completion pulmonary angiography was performed which demonstrated significantly improved perfusion of the bilateral pulmonary arteries. There was mild persistent perfusion defect in the right inferior lobe. Given difficulty of cannulation of the right inferior pulmonary artery in addition to significant improvement in vital signs, the procedure was terminated. The catheters and wires were removed. The right groin sheath was removed and the Perclose devices were deployed. There was some persistent venous appearing bleeding from the right groin site therefore manual compression was applied and a 0 Prolene stent was applied at the skin in figure-of-eight fashion achieving hemostasis. Sterile bandage was applied. The patient tolerated the procedure well was transferred to the intensive care unit in good condition. IMPRESSION: Technically successful catheter directed bilateral pulmonary artery aspiration thrombectomy. Ester Sides, MD [facility] Radiology Electronically Signed   By: Ester Sides M.D.   On: 07/08/2023 06:34   IR THROMBECT PRIM MECH INIT (INCLU) MOD SED Result Date: 07/08/2023 INDICATION: 63 year old female with history of acute, intermediate-high risk pulmonary embolism and bilateral lower extremity deep vein thrombosis in the setting of COVID infection with persistent dysnpea, hypoxia, tachycardia, and right heart failure status post 1/2 dose t-pa 3 days ago. EXAM: 1. Ultrasound guided vascular access of the right common femoral vein. 2. Bilateral pulmonary arteriography and pulmonary manometry. 3. Bilateral pulmonary artery catheter directed aspiration thrombectomy. COMPARISON:  07/04/2023, 07/07/2023 MEDICATIONS: None.  ANESTHESIA/SEDATION: Moderate (conscious) sedation was employed during this procedure. A total of Versed  1.5 mg and Fentanyl  50 mcg was administered intravenously. Moderate Sedation Time: 79 minutes. The patient's level of consciousness and vital signs were monitored continuously by radiology nursing throughout the procedure under my direct supervision. FLUOROSCOPY TIME:  One hundred seventy-one mGy COMPLICATIONS: None immediate. TECHNIQUE: The procedure was performed with the assistance of a my partner, Dr. Ami Bellman. Informed written consent was obtained from the patient after a thorough discussion of the procedural risks, benefits and alternatives. All questions were addressed. Maximal Sterile Barrier Technique was utilized including caps, mask, sterile gowns, sterile gloves, sterile drape, hand hygiene and skin antiseptic. A timeout was performed prior to the initiation of the procedure. Preprocedure ultrasound evaluation demonstrated patency of the right common femoral vein. The procedure was planned. Subdermal Local anesthesia was provided the planned needle entry site with 1% lidocaine . Skin nick was made. Under direct ultrasound visualization, the right common femoral vein was accessed with a 21 gauge micropuncture needle. A permanent ultrasound image was captured and stored in the record. A micropuncture sheath was introduced through which a Wholey wire was placed and directed to the inferior vena cava under fluoroscopic guidance. Dilation with an 8 French sheath was performed followed by placement of 2 ProGlide devices at the 10 o'clock and 2 o'clock positions in pre close fashion. Next, a 24 French sheath was placed with the tip placed in the perihepatic inferior vena cava. An angled pigtail catheter was then inserted over the wire and under fluoroscopic guidance guided to the main pulmonary artery. Pulmonary angiogram was then performed which demonstrated limited perfusion in the right  greater than  left pulmonary arteries. Pulmonary manometry was performed and measured at 71/30 mmHg with a mean pulmonary artery pressure of 46 mmHg. The [facility] wire was directed to the right pulmonary artery and the pigtail catheter was removed. The select catheter was unable to be advanced into the right pulmonary artery due to tortuosity. Therefore a 7 French, 70 cm Ansel sheath was inserted over the [facility] wire to the level of the main pulmonary artery. A Rosen wire was then directed into the right inferior pulmonary artery. A selective catheter was then inserted over the wire. The wire was exchanged for short taper superstiff Amplatz wire. The catheter and coaxial sheath were removed. Eight hundred twenty-four French FlowTriever aspiration catheter was inserted in the right inferior pulmonary artery and multiple aspirations were performed yielding acute appearing thrombus in the aspiration canister. The catheter was retracted into the right superior pulmonary artery an additional aspirations were performed yielding small volume acute appearing thrombus. Right pulmonary angiogram was then performed which demonstrated significantly improved perfusion to the right upper lobe. There is small volume residual thrombus in the right inferior pulmonary artery. At this point, the patient's tachycardia resolved and hypoxia improved. The aspiration catheter was then retracted into the proximal left pulmonary artery. Aspiration thrombectomy was again performed this location yielding minimal acute appearing thrombus. Therefore, the curved 20 French aspiration catheter was inserted in coaxial fashion into the left inferior pulmonary artery. Aspiration was repeated which yielded moderate volume acute appearing thrombus in the aspiration canister. Pulmonary manometry was then repeated measuring 48 over 18 mmHg with a mean pulmonary artery pressure of 31 mmHg. Completion pulmonary angiography was performed which demonstrated significantly  improved perfusion of the bilateral pulmonary arteries. There was mild persistent perfusion defect in the right inferior lobe. Given difficulty of cannulation of the right inferior pulmonary artery in addition to significant improvement in vital signs, the procedure was terminated. The catheters and wires were removed. The right groin sheath was removed and the Perclose devices were deployed. There was some persistent venous appearing bleeding from the right groin site therefore manual compression was applied and a 0 Prolene stent was applied at the skin in figure-of-eight fashion achieving hemostasis. Sterile bandage was applied. The patient tolerated the procedure well was transferred to the intensive care unit in good condition. IMPRESSION: Technically successful catheter directed bilateral pulmonary artery aspiration thrombectomy. Ester Sides, MD [facility] Radiology Electronically Signed   By: Ester Sides M.D.   On: 07/08/2023 06:34   IR THROMBECT PRIM MECH INIT (INCLU) MOD SED Result Date: 07/08/2023 INDICATION: 63 year old female with history of acute, intermediate-high risk pulmonary embolism and bilateral lower extremity deep vein thrombosis in the setting of COVID infection with persistent dysnpea, hypoxia, tachycardia, and right heart failure status post 1/2 dose t-pa 3 days ago. EXAM: 1. Ultrasound guided vascular access of the right common femoral vein. 2. Bilateral pulmonary arteriography and pulmonary manometry. 3. Bilateral pulmonary artery catheter directed aspiration thrombectomy. COMPARISON:  07/04/2023, 07/07/2023 MEDICATIONS: None. ANESTHESIA/SEDATION: Moderate (conscious) sedation was employed during this procedure. A total of Versed  1.5 mg and Fentanyl  50 mcg was administered intravenously. Moderate Sedation Time: 79 minutes. The patient's level of consciousness and vital signs were monitored continuously by radiology nursing throughout  the procedure under my direct supervision. FLUOROSCOPY TIME:  One hundred seventy-one mGy COMPLICATIONS: None immediate. TECHNIQUE: The procedure was performed with the assistance of a my partner, Dr. Ami Bellman. Informed written consent was obtained from  the patient after a thorough discussion of the procedural risks, benefits and alternatives. All questions were addressed. Maximal Sterile Barrier Technique was utilized including caps, mask, sterile gowns, sterile gloves, sterile drape, hand hygiene and skin antiseptic. A timeout was performed prior to the initiation of the procedure. Preprocedure ultrasound evaluation demonstrated patency of the right common femoral vein. The procedure was planned. Subdermal Local anesthesia was provided the planned needle entry site with 1% lidocaine . Skin nick was made. Under direct ultrasound visualization, the right common femoral vein was accessed with a 21 gauge micropuncture needle. A permanent ultrasound image was captured and stored in the record. A micropuncture sheath was introduced through which a Wholey wire was placed and directed to the inferior vena cava under fluoroscopic guidance. Dilation with an 8 French sheath was performed followed by placement of 2 ProGlide devices at the 10 o'clock and 2 o'clock positions in pre close fashion. Next, a 24 French sheath was placed with the tip placed in the perihepatic inferior vena cava. An angled pigtail catheter was then inserted over the wire and under fluoroscopic guidance guided to the main pulmonary artery. Pulmonary angiogram was then performed which demonstrated limited perfusion in the right greater than left pulmonary arteries. Pulmonary manometry was performed and measured at 71/30 mmHg with a mean pulmonary artery pressure of 46 mmHg. The [facility] wire was directed to the right pulmonary artery and the pigtail catheter was removed. The select catheter was unable to be advanced into the right pulmonary artery due  to tortuosity. Therefore a 7 French, 70 cm Ansel sheath was inserted over the [facility] wire to the level of the main pulmonary artery. A Rosen wire was then directed into the right inferior pulmonary artery. A selective catheter was then inserted over the wire. The wire was exchanged for short taper superstiff Amplatz wire. The catheter and coaxial sheath were removed. Eight hundred twenty-four French FlowTriever aspiration catheter was inserted in the right inferior pulmonary artery and multiple aspirations were performed yielding acute appearing thrombus in the aspiration canister. The catheter was retracted into the right superior pulmonary artery an additional aspirations were performed yielding small volume acute appearing thrombus. Right pulmonary angiogram was then performed which demonstrated significantly improved perfusion to the right upper lobe. There is small volume residual thrombus in the right inferior pulmonary artery. At this point, the patient's tachycardia resolved and hypoxia improved. The aspiration catheter was then retracted into the proximal left pulmonary artery. Aspiration thrombectomy was again performed this location yielding minimal acute appearing thrombus. Therefore, the curved 20 French aspiration catheter was inserted in coaxial fashion into the left inferior pulmonary artery. Aspiration was repeated which yielded moderate volume acute appearing thrombus in the aspiration canister. Pulmonary manometry was then repeated measuring 48 over 18 mmHg with a mean pulmonary artery pressure of 31 mmHg. Completion pulmonary angiography was performed which demonstrated significantly improved perfusion of the bilateral pulmonary arteries. There was mild persistent perfusion defect in the right inferior lobe. Given difficulty of cannulation of the right inferior pulmonary artery in addition to significant improvement in vital signs, the procedure was terminated. The catheters and wires were  removed. The right groin sheath was removed and the Perclose devices were deployed. There was some persistent venous appearing bleeding from the right groin site therefore manual compression was applied and a 0 Prolene stent was applied at the skin in figure-of-eight fashion achieving hemostasis. Sterile bandage was applied. The patient tolerated the procedure well was transferred to the intensive care unit  in good condition. IMPRESSION: Technically successful catheter directed bilateral pulmonary artery aspiration thrombectomy. Ester Sides, MD [facility] Radiology Electronically Signed   By: Ester Sides M.D.   On: 07/08/2023 06:34   IR US  Guide Vasc Access Right Result Date: 07/08/2023 INDICATION: 63 year old female with history of acute, intermediate-high risk pulmonary embolism and bilateral lower extremity deep vein thrombosis in the setting of COVID infection with persistent dysnpea, hypoxia, tachycardia, and right heart failure status post 1/2 dose t-pa 3 days ago. EXAM: 1. Ultrasound guided vascular access of the right common femoral vein. 2. Bilateral pulmonary arteriography and pulmonary manometry. 3. Bilateral pulmonary artery catheter directed aspiration thrombectomy. COMPARISON:  07/04/2023, 07/07/2023 MEDICATIONS: None. ANESTHESIA/SEDATION: Moderate (conscious) sedation was employed during this procedure. A total of Versed  1.5 mg and Fentanyl  50 mcg was administered intravenously. Moderate Sedation Time: 79 minutes. The patient's level of consciousness and vital signs were monitored continuously by radiology nursing throughout the procedure under my direct supervision. FLUOROSCOPY TIME:  One hundred seventy-one mGy COMPLICATIONS: None immediate. TECHNIQUE: The procedure was performed with the assistance of a my partner, Dr. Ami Bellman. Informed written consent was obtained from the patient after a thorough discussion of the procedural risks,  benefits and alternatives. All questions were addressed. Maximal Sterile Barrier Technique was utilized including caps, mask, sterile gowns, sterile gloves, sterile drape, hand hygiene and skin antiseptic. A timeout was performed prior to the initiation of the procedure. Preprocedure ultrasound evaluation demonstrated patency of the right common femoral vein. The procedure was planned. Subdermal Local anesthesia was provided the planned needle entry site with 1% lidocaine . Skin nick was made. Under direct ultrasound visualization, the right common femoral vein was accessed with a 21 gauge micropuncture needle. A permanent ultrasound image was captured and stored in the record. A micropuncture sheath was introduced through which a Wholey wire was placed and directed to the inferior vena cava under fluoroscopic guidance. Dilation with an 8 French sheath was performed followed by placement of 2 ProGlide devices at the 10 o'clock and 2 o'clock positions in pre close fashion. Next, a 24 French sheath was placed with the tip placed in the perihepatic inferior vena cava. An angled pigtail catheter was then inserted over the wire and under fluoroscopic guidance guided to the main pulmonary artery. Pulmonary angiogram was then performed which demonstrated limited perfusion in the right greater than left pulmonary arteries. Pulmonary manometry was performed and measured at 71/30 mmHg with a mean pulmonary artery pressure of 46 mmHg. The [facility] wire was directed to the right pulmonary artery and the pigtail catheter was removed. The select catheter was unable to be advanced into the right pulmonary artery due to tortuosity. Therefore a 7 French, 70 cm Ansel sheath was inserted over the [facility] wire to the level of the main pulmonary artery. A Rosen wire was then directed into the right inferior pulmonary artery. A selective catheter was then inserted over the wire. The wire was exchanged for short taper superstiff Amplatz wire.  The catheter and coaxial sheath were removed. Eight hundred twenty-four French FlowTriever aspiration catheter was inserted in the right inferior pulmonary artery and multiple aspirations were performed yielding acute appearing thrombus in the aspiration canister. The catheter was retracted into the right superior pulmonary artery an additional aspirations were performed yielding small volume acute appearing thrombus. Right pulmonary angiogram was then performed which demonstrated significantly improved perfusion to the right upper lobe. There is small volume residual thrombus in the right  inferior pulmonary artery. At this point, the patient's tachycardia resolved and hypoxia improved. The aspiration catheter was then retracted into the proximal left pulmonary artery. Aspiration thrombectomy was again performed this location yielding minimal acute appearing thrombus. Therefore, the curved 20 French aspiration catheter was inserted in coaxial fashion into the left inferior pulmonary artery. Aspiration was repeated which yielded moderate volume acute appearing thrombus in the aspiration canister. Pulmonary manometry was then repeated measuring 48 over 18 mmHg with a mean pulmonary artery pressure of 31 mmHg. Completion pulmonary angiography was performed which demonstrated significantly improved perfusion of the bilateral pulmonary arteries. There was mild persistent perfusion defect in the right inferior lobe. Given difficulty of cannulation of the right inferior pulmonary artery in addition to significant improvement in vital signs, the procedure was terminated. The catheters and wires were removed. The right groin sheath was removed and the Perclose devices were deployed. There was some persistent venous appearing bleeding from the right groin site therefore manual compression was applied and a 0 Prolene stent was applied at the skin in figure-of-eight fashion achieving hemostasis. Sterile bandage was applied. The  patient tolerated the procedure well was transferred to the intensive care unit in good condition. IMPRESSION: Technically successful catheter directed bilateral pulmonary artery aspiration thrombectomy. Ester Sides, MD [facility] Radiology Electronically Signed   By: Ester Sides M.D.   On: 07/08/2023 06:34   IR Angiogram Selective Each Additional Vessel Result Date: 07/08/2023 INDICATION: 63 year old female with history of acute, intermediate-high risk pulmonary embolism and bilateral lower extremity deep vein thrombosis in the setting of COVID infection with persistent dysnpea, hypoxia, tachycardia, and right heart failure status post 1/2 dose t-pa 3 days ago. EXAM: 1. Ultrasound guided vascular access of the right common femoral vein. 2. Bilateral pulmonary arteriography and pulmonary manometry. 3. Bilateral pulmonary artery catheter directed aspiration thrombectomy. COMPARISON:  07/04/2023, 07/07/2023 MEDICATIONS: None. ANESTHESIA/SEDATION: Moderate (conscious) sedation was employed during this procedure. A total of Versed  1.5 mg and Fentanyl  50 mcg was administered intravenously. Moderate Sedation Time: 79 minutes. The patient's level of consciousness and vital signs were monitored continuously by radiology nursing throughout the procedure under my direct supervision. FLUOROSCOPY TIME:  One hundred seventy-one mGy COMPLICATIONS: None immediate. TECHNIQUE: The procedure was performed with the assistance of a my partner, Dr. Ami Bellman. Informed written consent was obtained from the patient after a thorough discussion of the procedural risks, benefits and alternatives. All questions were addressed. Maximal Sterile Barrier Technique was utilized including caps, mask, sterile gowns, sterile gloves, sterile drape, hand hygiene and skin antiseptic. A timeout was performed prior to the initiation of the procedure. Preprocedure ultrasound evaluation demonstrated  patency of the right common femoral vein. The procedure was planned. Subdermal Local anesthesia was provided the planned needle entry site with 1% lidocaine . Skin nick was made. Under direct ultrasound visualization, the right common femoral vein was accessed with a 21 gauge micropuncture needle. A permanent ultrasound image was captured and stored in the record. A micropuncture sheath was introduced through which a Wholey wire was placed and directed to the inferior vena cava under fluoroscopic guidance. Dilation with an 8 French sheath was performed followed by placement of 2 ProGlide devices at the 10 o'clock and 2 o'clock positions in pre close fashion. Next, a 24 French sheath was placed with the tip placed in the perihepatic inferior vena cava. An angled pigtail catheter was then inserted over the wire and under fluoroscopic guidance guided to the main  pulmonary artery. Pulmonary angiogram was then performed which demonstrated limited perfusion in the right greater than left pulmonary arteries. Pulmonary manometry was performed and measured at 71/30 mmHg with a mean pulmonary artery pressure of 46 mmHg. The [facility] wire was directed to the right pulmonary artery and the pigtail catheter was removed. The select catheter was unable to be advanced into the right pulmonary artery due to tortuosity. Therefore a 7 French, 70 cm Ansel sheath was inserted over the [facility] wire to the level of the main pulmonary artery. A Rosen wire was then directed into the right inferior pulmonary artery. A selective catheter was then inserted over the wire. The wire was exchanged for short taper superstiff Amplatz wire. The catheter and coaxial sheath were removed. Eight hundred twenty-four French FlowTriever aspiration catheter was inserted in the right inferior pulmonary artery and multiple aspirations were performed yielding acute appearing thrombus in the aspiration canister. The catheter was retracted into the right superior  pulmonary artery an additional aspirations were performed yielding small volume acute appearing thrombus. Right pulmonary angiogram was then performed which demonstrated significantly improved perfusion to the right upper lobe. There is small volume residual thrombus in the right inferior pulmonary artery. At this point, the patient's tachycardia resolved and hypoxia improved. The aspiration catheter was then retracted into the proximal left pulmonary artery. Aspiration thrombectomy was again performed this location yielding minimal acute appearing thrombus. Therefore, the curved 20 French aspiration catheter was inserted in coaxial fashion into the left inferior pulmonary artery. Aspiration was repeated which yielded moderate volume acute appearing thrombus in the aspiration canister. Pulmonary manometry was then repeated measuring 48 over 18 mmHg with a mean pulmonary artery pressure of 31 mmHg. Completion pulmonary angiography was performed which demonstrated significantly improved perfusion of the bilateral pulmonary arteries. There was mild persistent perfusion defect in the right inferior lobe. Given difficulty of cannulation of the right inferior pulmonary artery in addition to significant improvement in vital signs, the procedure was terminated. The catheters and wires were removed. The right groin sheath was removed and the Perclose devices were deployed. There was some persistent venous appearing bleeding from the right groin site therefore manual compression was applied and a 0 Prolene stent was applied at the skin in figure-of-eight fashion achieving hemostasis. Sterile bandage was applied. The patient tolerated the procedure well was transferred to the intensive care unit in good condition. IMPRESSION: Technically successful catheter directed bilateral pulmonary artery aspiration thrombectomy. Ester Sides, MD [facility] Radiology Electronically Signed    By: Ester Sides M.D.   On: 07/08/2023 06:34   IR Angiogram Selective Each Additional Vessel Result Date: 07/08/2023 INDICATION: 63 year old female with history of acute, intermediate-high risk pulmonary embolism and bilateral lower extremity deep vein thrombosis in the setting of COVID infection with persistent dysnpea, hypoxia, tachycardia, and right heart failure status post 1/2 dose t-pa 3 days ago. EXAM: 1. Ultrasound guided vascular access of the right common femoral vein. 2. Bilateral pulmonary arteriography and pulmonary manometry. 3. Bilateral pulmonary artery catheter directed aspiration thrombectomy. COMPARISON:  07/04/2023, 07/07/2023 MEDICATIONS: None. ANESTHESIA/SEDATION: Moderate (conscious) sedation was employed during this procedure. A total of Versed  1.5 mg and Fentanyl  50 mcg was administered intravenously. Moderate Sedation Time: 79 minutes. The patient's level of consciousness and vital signs were monitored continuously by radiology nursing throughout the procedure under my direct supervision. FLUOROSCOPY TIME:  One hundred seventy-one mGy COMPLICATIONS: None immediate. TECHNIQUE: The procedure was performed with the assistance of a  my partner, Dr. Ami Bellman. Informed written consent was obtained from the patient after a thorough discussion of the procedural risks, benefits and alternatives. All questions were addressed. Maximal Sterile Barrier Technique was utilized including caps, mask, sterile gowns, sterile gloves, sterile drape, hand hygiene and skin antiseptic. A timeout was performed prior to the initiation of the procedure. Preprocedure ultrasound evaluation demonstrated patency of the right common femoral vein. The procedure was planned. Subdermal Local anesthesia was provided the planned needle entry site with 1% lidocaine . Skin nick was made. Under direct ultrasound visualization, the right common femoral vein was accessed with a 21 gauge micropuncture needle. A permanent  ultrasound image was captured and stored in the record. A micropuncture sheath was introduced through which a Wholey wire was placed and directed to the inferior vena cava under fluoroscopic guidance. Dilation with an 8 French sheath was performed followed by placement of 2 ProGlide devices at the 10 o'clock and 2 o'clock positions in pre close fashion. Next, a 24 French sheath was placed with the tip placed in the perihepatic inferior vena cava. An angled pigtail catheter was then inserted over the wire and under fluoroscopic guidance guided to the main pulmonary artery. Pulmonary angiogram was then performed which demonstrated limited perfusion in the right greater than left pulmonary arteries. Pulmonary manometry was performed and measured at 71/30 mmHg with a mean pulmonary artery pressure of 46 mmHg. The [facility] wire was directed to the right pulmonary artery and the pigtail catheter was removed. The select catheter was unable to be advanced into the right pulmonary artery due to tortuosity. Therefore a 7 French, 70 cm Ansel sheath was inserted over the [facility] wire to the level of the main pulmonary artery. A Rosen wire was then directed into the right inferior pulmonary artery. A selective catheter was then inserted over the wire. The wire was exchanged for short taper superstiff Amplatz wire. The catheter and coaxial sheath were removed. Eight hundred twenty-four French FlowTriever aspiration catheter was inserted in the right inferior pulmonary artery and multiple aspirations were performed yielding acute appearing thrombus in the aspiration canister. The catheter was retracted into the right superior pulmonary artery an additional aspirations were performed yielding small volume acute appearing thrombus. Right pulmonary angiogram was then performed which demonstrated significantly improved perfusion to the right upper lobe. There is small volume residual thrombus in the right inferior pulmonary artery. At  this point, the patient's tachycardia resolved and hypoxia improved. The aspiration catheter was then retracted into the proximal left pulmonary artery. Aspiration thrombectomy was again performed this location yielding minimal acute appearing thrombus. Therefore, the curved 20 French aspiration catheter was inserted in coaxial fashion into the left inferior pulmonary artery. Aspiration was repeated which yielded moderate volume acute appearing thrombus in the aspiration canister. Pulmonary manometry was then repeated measuring 48 over 18 mmHg with a mean pulmonary artery pressure of 31 mmHg. Completion pulmonary angiography was performed which demonstrated significantly improved perfusion of the bilateral pulmonary arteries. There was mild persistent perfusion defect in the right inferior lobe. Given difficulty of cannulation of the right inferior pulmonary artery in addition to significant improvement in vital signs, the procedure was terminated. The catheters and wires were removed. The right groin sheath was removed and the Perclose devices were deployed. There was some persistent venous appearing bleeding from the right groin site therefore manual compression was applied and a 0 Prolene stent was applied at the skin in figure-of-eight fashion achieving hemostasis. Sterile bandage was applied. The patient  tolerated the procedure well was transferred to the intensive care unit in good condition. IMPRESSION: Technically successful catheter directed bilateral pulmonary artery aspiration thrombectomy. Ester Sides, MD [facility] Radiology Electronically Signed   By: Ester Sides M.D.   On: 07/08/2023 06:34   CT Angio Abd/Pel w/ and/or w/o Result Date: 07/07/2023 CLINICAL DATA:  63 year old female with acute PE on CTA 3 days ago. Saddle embolus at that time. Extensive DVT. Query IVC thrombus. Re-evaluate PE. EXAM: CTA ABDOMEN AND PELVIS WITHOUT AND WITH CONTRAST  TECHNIQUE: Multidetector CT imaging of the abdomen and pelvis was performed using the standard protocol during bolus administration of intravenous contrast. Multiplanar reconstructed images and MIPs were obtained and reviewed to evaluate the vascular anatomy. RADIATION DOSE REDUCTION: This exam was performed according to the departmental dose-optimization program which includes automated exposure control, adjustment of the mA and/or kV according to patient size and/or use of iterative reconstruction technique. CONTRAST:  75mL OMNIPAQUE  IOHEXOL  350 MG/ML SOLN COMPARISON:  CTA chest today reported separately. CTA chest on 07/04/2023. FINDINGS: VASCULAR Reflux of right atrial contrast into the hepatic veins and hepatic IVC appearing unchanged from the CTA on 07/04/2023. Otherwise no inferior vena cava contrast is present. Venous Vascular patency is not evaluated in the absence of IV contrast. Abdominal aorta timed vascular contrast. Normal caliber abdominal aorta and major arterial structures in the abdomen and pelvis appear patent and unremarkable. Review of the MIP images confirms the above findings. NON-VASCULAR Lower chest: Lower lobe PE stable to that reported separately today. Hepatobiliary: Cholecystectomy. Largely noncontrast appearance of the liver except for hepatic venous contrast reflux. No liver abnormality identified. Pancreas: Negative. Spleen: Negative. Adrenals/Urinary Tract: Bilateral low-density adrenal gland thickening compatible with hyperplasia. Nonobstructed kidneys with symmetric renal enhancement. Small exophytic simple fluid density left renal midpole cyst suspected on series 5, image 70 (no follow-up imaging recommended). Decompressed ureters and bladder. Numerous pelvic phleboliths. Stomach/Bowel: Extensive large bowel diverticulosis from the hepatic flexure through the sigmoid colon. No active inflammation identified in those segments. Normal appendix on series 5, image 119. Decompressed  terminal ileum and no dilated small bowel. Decompressed stomach and duodenum. No free air or free fluid. Lymphatic: No lymphadenopathy in the abdomen or pelvis. Reproductive: Within normal limits. Other: No pelvis free fluid. Musculoskeletal: Widespread severe lumbar spine degeneration. Extensive vacuum disc and vacuum facet degenerative changes. Multifactorial spinal stenosis appears maximal in severe at L4-L5. No acute or suspicious osseous abnormality identified. IMPRESSION: 1. Aortic vascular contrast timing on this CTA abdomen and pelvis. Vena cava, Venous patency is not evaluated. A routine CT Abdomen and Pelvis with standard portal venous and delayed renal excretory phase IV contrast timing would best evaluate the IVC. Reflux of right heart contrast to the hepatic veins appears unchanged since 07/04/2023. 2. Abnormal CTA Chest today is reported separately. 3. No other acute or inflammatory process identified in the abdomen or pelvis. Extensive large bowel diverticulosis. Severe lumbar spine degeneration with multifactorial spinal stenosis. Electronically Signed   By: VEAR Hurst M.D.   On: 07/07/2023 12:36   CT Angio Chest Pulmonary Embolism (PE) W or WO Contrast Result Date: 07/07/2023 CLINICAL DATA:  63 year old female with acute PE on CTA 3 days ago. Saddle embolus at that time. Extensive DVT. Query IVC thrombus. Re-evaluate PE. EXAM: CT ANGIOGRAPHY CHEST WITH CONTRAST TECHNIQUE: Multidetector CT imaging of the chest was performed using the standard protocol during bolus administration of intravenous contrast. Multiplanar CT image reconstructions and MIPs were obtained to evaluate  the vascular anatomy. RADIATION DOSE REDUCTION: This exam was performed according to the departmental dose-optimization program which includes automated exposure control, adjustment of the mA and/or kV according to patient size and/or use of iterative reconstruction technique. CONTRAST:  75mL OMNIPAQUE  IOHEXOL  350 MG/ML SOLN  COMPARISON:  CTA chest 07/04/2023. FINDINGS: Cardiovascular: Good contrast bolus timing in the pulmonary arterial tree. Central pulmonary artery enlargement is stable since 07/04/2023. Saddle thrombus has resolved since that time but there is ongoing bilateral lobar pulmonary embolus, bulky in the right lung and left lower lobe. RV LV ratio remains abnormal (series 5, image 185. No pericardial effusion. Little contrast in the thoracic aorta. Mediastinum/Nodes: Stable and negative, no mediastinal mass or lymphadenopathy. Lungs/Pleura: Major airways remain patent. Lung volumes are mildly improved, less respiratory motion. No discrete pulmonary infarct. No consolidation or pleural effusion. Upper Abdomen: CTA abdomen and pelvis today reported separately. Musculoskeletal: Stable. No acute or suspicious osseous lesion in the chest. Lower cervical spine and midthoracic endplate degeneration and spurring. Review of the MIP images confirms the above findings. IMPRESSION: 1. Ongoing bilateral lobar Pulmonary Emboli. Saddle thrombus has resolved since 07/04/2023, central pulmonary artery enlargement and abnormal RV LV ratio are unchanged. 2. No pulmonary infarct.  No pleural or pericardial effusion. 3. CTA Abdomen and Pelvis today reported separately. Electronically Signed   By: VEAR Hurst M.D.   On: 07/07/2023 12:27   ECHOCARDIOGRAM COMPLETE Result Date: 07/06/2023    ECHOCARDIOGRAM REPORT   Patient Name:   NEDDA GAINS Date of Exam: 07/06/2023 Medical Rec #:  [id number]            Height:       60.0 in Accession #:    [id number]           Weight:       203.3 lb Date of Birth:  10-11-1960           BSA:          1.879 m Patient Age:    62 years             BP:           153/86 mmHg Patient Gender: F                    HR:           102 bpm. Exam Location:  Inpatient Procedure: 2D Echo, Cardiac Doppler, Color Doppler and Intracardiac            Opacification Agent Indications:    Pulmonary Embolus I26.09  History:         Patient has no prior history of Echocardiogram examinations.                 Risk Factors:Hypertension.  Sonographer:    Thea Norlander RCS Referring Phys: [phone] PETER E BABCOCK IMPRESSIONS  1. Left ventricular ejection fraction, by estimation, is >75%. The left ventricle has hyperdynamic function. The left ventricle has no regional wall motion abnormalities. Left ventricular diastolic parameters are consistent with Grade I diastolic dysfunction (impaired relaxation).  2. Right ventricular systolic function is severely reduced. The right ventricular size is moderately enlarged. There is mildly elevated pulmonary artery systolic pressure. The estimated right ventricular systolic pressure is 43.5 mmHg.  3. Right atrial size was mildly dilated.  4. The mitral valve is normal in structure. No evidence of mitral valve regurgitation.  5. The aortic valve is tricuspid. Aortic valve regurgitation is not visualized.  6. The inferior  vena cava is dilated in size with <50% respiratory variability, suggesting right atrial pressure of 15 mmHg. Comparison(s): No prior Echocardiogram. Conclusion(s)/Recommendation(s): Findings suggestive of significant RV strain in the setting of pulmonary embolus. FINDINGS  Left Ventricle: Left ventricular ejection fraction, by estimation, is >75%. The left ventricle has hyperdynamic function. The left ventricle has no regional wall motion abnormalities. Definity  contrast agent was given IV to delineate the left ventricular endocardial borders. The left ventricular internal cavity size was normal in size. There is no left ventricular hypertrophy. Left ventricular diastolic parameters are consistent with Grade I diastolic dysfunction (impaired relaxation). Indeterminate filling pressures. Right Ventricle: The right ventricular size is moderately enlarged. No increase in right ventricular wall thickness. Right ventricular systolic function is severely reduced. There is mildly elevated pulmonary  artery systolic pressure. The tricuspid regurgitant velocity is 2.67 m/s, and with an assumed right atrial pressure of 15 mmHg, the estimated right ventricular systolic pressure is 43.5 mmHg. Left Atrium: Left atrial size was normal in size. Right Atrium: Right atrial size was mildly dilated. Pericardium: There is no evidence of pericardial effusion. Mitral Valve: The mitral valve is normal in structure. No evidence of mitral valve regurgitation. Tricuspid Valve: The tricuspid valve is grossly normal. Tricuspid valve regurgitation is trivial. Aortic Valve: The aortic valve is tricuspid. Aortic valve regurgitation is not visualized. Aortic valve peak gradient measures 9.9 mmHg. Pulmonic Valve: The pulmonic valve was normal in structure. Pulmonic valve regurgitation is not visualized. Aorta: The aortic root and ascending aorta are structurally normal, with no evidence of dilitation. Venous: The inferior vena cava is dilated in size with less than 50% respiratory variability, suggesting right atrial pressure of 15 mmHg. IAS/Shunts: No atrial level shunt detected by color flow Doppler.  LEFT VENTRICLE PLAX 2D LVIDd:         3.80 cm   Diastology LVIDs:         1.50 cm   LV e' medial:    8.27 cm/s LV PW:         1.00 cm   LV E/e' medial:  10.7 LV IVS:        0.90 cm   LV e' lateral:   11.50 cm/s LVOT diam:     1.90 cm   LV E/e' lateral: 7.7 LV SV:         43 LV SV Index:   23 LVOT Area:     2.84 cm  RIGHT VENTRICLE             IVC RV S prime:     16.70 cm/s  IVC diam: 2.10 cm TAPSE (M-mode): 1.8 cm LEFT ATRIUM           Index        RIGHT ATRIUM           Index LA diam:      2.60 cm 1.38 cm/m   RA Area:     17.10 cm LA Vol (A2C): 28.5 ml 15.16 ml/m  RA Volume:   47.10 ml  25.06 ml/m LA Vol (A4C): 25.7 ml 13.67 ml/m  AORTIC VALVE AV Area (Vmax): 1.77 cm AV Vmax:        157.00 cm/s AV Peak Grad:   9.9 mmHg LVOT Vmax:      97.82 cm/s LVOT Vmean:     62.575 cm/s LVOT VTI:       0.153 m  AORTA Ao Root diam: 2.50 cm Ao  Asc diam:  3.10 cm MITRAL VALVE  TRICUSPID VALVE MV Area (PHT): 5.31 cm     TR Peak grad:   28.5 mmHg MV Decel Time: 143 msec     TR Vmax:        267.00 cm/s MV E velocity: 88.10 cm/s MV A velocity: 128.00 cm/s  SHUNTS MV E/A ratio:  0.69         Systemic VTI:  0.15 m                             Systemic Diam: 1.90 cm Vinie Maxcy MD Electronically signed by Vinie Maxcy MD Signature Date/Time: 07/06/2023/4:39:02 PM    Final    VAS US  IVC/ILIAC ([facility] ONLY) Result Date: 07/06/2023 IVC/ILIAC STUDY Patient Name:  Maryiah Olvey  Date of Exam:   07/06/2023 Medical Rec #: [id number]             Accession #:    [id number] Date of Birth: August 31, 1960            Patient Gender: F Patient Age:   81 years Exam Location:  [facility] Procedure:      VAS US  IVC/ILIAC ([facility] ONLY) Referring Phys: --------------------------------------------------------------------------------  Indications: Extensive DVT and massive PE Other Factors: Covid 19 infection.  Comparison Study: No prior study on file Performing Technologist: Alberta Lis RVS  Examination Guidelines: A complete evaluation includes B-mode imaging, spectral Doppler, color Doppler, and power Doppler as needed of all accessible portions of each vessel. Bilateral testing is considered an integral part of a complete examination. Limited examinations for reoccurring indications may be performed as noted.  IVC/Iliac Findings: +----------+------+--------+----------------+    IVC    PatentThrombus    Comments     +----------+------+--------+----------------+ IVC Prox         acute  partial thrombus +----------+------+--------+----------------+ IVC Mid          acute  partial thrombus +----------+------+--------+----------------+ IVC Distal       acute  partial thrombus +----------+------+--------+----------------+  +------------------+---------+-----------+---------+-----------+---------------+        CIV         RT-PatentRT-ThrombusLT-PatentLT-Thrombus   Comments     +------------------+---------+-----------+---------+-----------+---------------+ Common Iliac Prox  patent                         acute       partial                                                                  thrombus     +------------------+---------+-----------+---------+-----------+---------------+ Common Iliac Mid   patent                         acute       partial                                                                  thrombus     +------------------+---------+-----------+---------+-----------+---------------+ Common Iliac       patent  acute       partial     Distal                                                       thrombus     +------------------+---------+-----------+---------+-----------+---------------+  +-----------------+---------+-----------+---------+-----------+----------------+        EIV       RT-PatentRT-ThrombusLT-PatentLT-Thrombus    Comments     +-----------------+---------+-----------+---------+-----------+----------------+ External Iliac    patent                         acute   partial thrombus Vein Prox                                                                 +-----------------+---------+-----------+---------+-----------+----------------+ External Iliac    patent                         acute   partial thrombus Vein Mid                                                                  +-----------------+---------+-----------+---------+-----------+----------------+ External Iliac    patent                         acute   partial thrombus Vein Distal                                                               +-----------------+---------+-----------+---------+-----------+----------------+  Summary: IVC/Iliac: Partial acute thrombus noted throughout the left external and common iliac veins  and the IVC.  *See table(s) above for measurements and observations.  Electronically signed by Lonni Gaskins MD on 07/06/2023 at 1:51:45 PM.    Final    VAS US  LOWER EXTREMITY VENOUS (DVT) Result Date: 07/06/2023  Lower Venous DVT Study Patient Name:  TEIRRA CARAPIA  Date of Exam:   07/06/2023 Medical Rec #: [id number]             Accession #:    [id number] Date of Birth: 1960-10-14            Patient Gender: F Patient Age:   87 years Exam Location:  [facility] Procedure:      VAS US  LOWER EXTREMITY VENOUS (DVT) Referring Phys: DORN CHILL --------------------------------------------------------------------------------  Indications: Massive Pulmonary embolism, SOB, and Covid 19 infection.  Anticoagulation: Heparin . Comparison Study: No prior study on file Performing Technologist: Alberta Lis RVS  Examination Guidelines: A complete evaluation includes B-mode imaging, spectral Doppler, color Doppler, and power Doppler as needed of all accessible portions of each vessel. Bilateral testing is considered an  integral part of a complete examination. Limited examinations for reoccurring indications may be performed as noted. The reflux portion of the exam is performed with the patient in reverse Trendelenburg.  +---------+---------------+---------+-----------+---------------+-------------+ RIGHT    CompressibilityPhasicitySpontaneityProperties     Thrombus                                                                 Aging         +---------+---------------+---------+-----------+---------------+-------------+ CFV      Full           Yes      No         pulsatile                                                                waveform                     +---------+---------------+---------+-----------+---------------+-------------+ SFJ      Full                                                             +---------+---------------+---------+-----------+---------------+-------------+ FV Prox  Partial                                           Acute         +---------+---------------+---------+-----------+---------------+-------------+ FV Mid   Partial        No       No                        Acute         +---------+---------------+---------+-----------+---------------+-------------+ FV DistalPartial        Yes      No         pulsatile      Acute                                                     waveform                     +---------+---------------+---------+-----------+---------------+-------------+ PFV      Partial                                           Acute         +---------+---------------+---------+-----------+---------------+-------------+ POP      Partial        No       No  Acute         +---------+---------------+---------+-----------+---------------+-------------+ PTV      None                                              Acute         +---------+---------------+---------+-----------+---------------+-------------+ PERO     None                                              Acute         +---------+---------------+---------+-----------+---------------+-------------+ Gastroc  Partial        Yes      No         pulsatile      Acute                                                     waveform                     +---------+---------------+---------+-----------+---------------+-------------+   +---------+---------------+---------+-----------+---------------+-------------+ LEFT     CompressibilityPhasicitySpontaneityProperties     Thrombus                                                                 Aging         +---------+---------------+---------+-----------+---------------+-------------+ CFV      Partial        Yes      No         pulsatile      Acute                                                      waveform                     +---------+---------------+---------+-----------+---------------+-------------+ SFJ      Partial                                           Acute         +---------+---------------+---------+-----------+---------------+-------------+ FV Prox  Partial        No       No                        Acute         +---------+---------------+---------+-----------+---------------+-------------+ FV Mid   None           No       No                        Acute         +---------+---------------+---------+-----------+---------------+-------------+  FV DistalNone           No       No                        Acute         +---------+---------------+---------+-----------+---------------+-------------+ PFV      Full           Yes      No         pulsatile      Acute                                                     waveform                     +---------+---------------+---------+-----------+---------------+-------------+ POP      None           No       No                        Acute         +---------+---------------+---------+-----------+---------------+-------------+ PTV      None                                              Acute         +---------+---------------+---------+-----------+---------------+-------------+ PERO     None                                              Acute         +---------+---------------+---------+-----------+---------------+-------------+ Gastroc  None           No       No                        Acute         +---------+---------------+---------+-----------+---------------+-------------+     Summary: RIGHT: - Findings consistent with acute deep vein thrombosis involving the right femoral vein, right proximal profunda vein, right popliteal vein, right posterior tibial veins, and right peroneal veins. Findings consistent with acute intramuscular thrombosis involving the right  gastrocnemius veins. - No cystic structure found in the popliteal fossa. pulsatile waveforms noted  LEFT: - Findings consistent with acute deep vein thrombosis involving the left common femoral vein, SF junction, left femoral vein, left popliteal vein, left posterior tibial veins, and left peroneal veins. Findings consistent with acute intramuscular thrombosis involving the left gastrocnemius veins. - No cystic structure found in the popliteal fossa. Pulsatile waveforms noted.  *See table(s) above for measurements and observations. Electronically signed by Lonni Gaskins MD on 07/06/2023 at 1:51:07 PM.    Final    CT Angio Chest PE W and/or Wo Contrast Result Date: 07/04/2023 CLINICAL DATA:  Increasing shortness of breath EXAM: CT ANGIOGRAPHY CHEST WITH CONTRAST TECHNIQUE: Multidetector CT imaging of the chest was performed using the standard protocol during bolus administration of intravenous contrast. Multiplanar CT image reconstructions and MIPs were obtained to evaluate the vascular anatomy. RADIATION DOSE REDUCTION: This  exam was performed according to the departmental dose-optimization program which includes automated exposure control, adjustment of the mA and/or kV according to patient size and/or use of iterative reconstruction technique. CONTRAST:  75mL OMNIPAQUE  IOHEXOL  350 MG/ML SOLN COMPARISON:  Chest x-ray from earlier in the same day. FINDINGS: Cardiovascular: Thoracic aorta is within normal limits. No aneurysmal dilatation or dissection is noted. Pulmonary artery is enlarged centrally with extensive bilateral pulmonary embolus with evidence of a saddle component centrally. Right ventricle is significantly enlarged consistent with right heart strain. RV/LV ratio calculates at 3. No coronary calcifications are noted. Contrast reflux into the hepatic veins is noted consistent with a degree of elevated right heart pressures. Mediastinum/Nodes: Thoracic inlet is within normal limits. No hilar or  mediastinal adenopathy is noted. The esophagus as visualized is within normal limits. Lungs/Pleura: Lungs are well aerated bilaterally. No focal infiltrate or sizable effusion is seen. Upper Abdomen: Visualized upper abdomen shows some nodularity of the adrenal glands bilaterally. Musculoskeletal: No chest wall abnormality. No acute or significant osseous findings. Review of the MIP images confirms the above findings. IMPRESSION: Positive for acute bilateral PE with CT evidence of right heart strain (RV/LV Ratio = 3) consistent with at least submassive (intermediate risk) PE. The presence of right heart strain has been associated with an increased risk of morbidity and mortality. Please refer to the Code PE Focused order set in [facility] Critical Value/emergent results were called by telephone at the time of interpretation on 07/04/2023 at 9:26 pm to Dr. CAMERON ISAACS , who verbally acknowledged these results. Electronically Signed   By: Oneil Devonshire M.D.   On: 07/04/2023 21:31   DG Chest Port 1 View Result Date: 07/04/2023 CLINICAL DATA:  Shortness of breath with positive COVID test EXAM: PORTABLE CHEST 1 VIEW COMPARISON:  None Available. FINDINGS: Cardiac shadow is within normal limits. Lungs are well aerated bilaterally. Mild right perihilar opacity is noted consistent with early infiltrate. No sizable effusion is seen. No bony abnormality is noted. IMPRESSION: Mild right perihilar infiltrate. Electronically Signed   By: Oneil Devonshire M.D.   On: 07/04/2023 19:55    Labs:  CBC: Recent Labs    07/05/23 0125 07/06/23 0212 07/07/23 0229 07/08/23 0233  WBC 14.9* 13.4* 14.2* 13.2*  HGB 13.1 10.8* 11.4* 11.6*  HCT 40.2 33.2* 34.6* 35.7*  PLT 114* 153 187 181    COAGS: Recent Labs    07/04/23 2022 07/05/23 0125  INR 1.1 1.3*  APTT 26 50*    BMP: Recent Labs    07/04/23 1937 07/05/23 0416 07/07/23 0229  NA 131* 136 135  K 3.6 3.8 4.0  CL 96* 102 102  CO2 20* 19* 23  GLUCOSE 235* 114* 125*   BUN 30* 28* 27*  CALCIUM 8.7* 8.4* 8.7*  CREATININE 1.57* 1.33* 1.11*  GFRNONAA 37* 45* 56*    LIVER FUNCTION TESTS: Recent Labs    07/04/23 2022  BILITOT 0.6  AST 29  ALT 24  ALKPHOS 78  PROT 7.6  ALBUMIN 3.6    Assessment and Plan: Acute massive pulmonary embolism s/p thrombectomy 1/7 Patient resting comfortably in chair this afternoon.  Does get short of breath with elevated HR during transfer.  Overall feels she is breathing slightly better.  On 0-2L [place] as needed with stability at rest.  Suture removed.  Groin site stable.  No dressing in place at this time but can be applied if noted.   IR remains available.   Electronically Signed: Marshay Slates Sue-Ellen Darnisha Vernet, PA  07/08/2023, 2:59 PM   I spent a total of 15 Minutes at the the patient's bedside AND on the patient's hospital floor or unit, greater than 50% of which was counseling/coordinating care for pulmonary embolism.

## 2023-07-08 NOTE — Plan of Care (Signed)
  Problem: Education: Goal: Knowledge of risk factors and measures for prevention of condition will improve Outcome: Progressing   Problem: Coping: Goal: Psychosocial and spiritual needs will be supported Outcome: Progressing   Problem: Respiratory: Goal: Will maintain a patent airway Outcome: Progressing Goal: Complications related to the disease process, condition or treatment will be avoided or minimized Outcome: Progressing   Problem: Education: Goal: Knowledge of General Education information will improve Description: Including pain rating scale, medication(s)/side effects and non-pharmacologic comfort measures Outcome: Progressing   Problem: Health Behavior/Discharge Planning: Goal: Ability to manage health-related needs will improve Outcome: Progressing   Problem: Clinical Measurements: Goal: Ability to maintain clinical measurements within normal limits will improve Outcome: Progressing Goal: Will remain free from infection Outcome: Progressing Goal: Diagnostic test results will improve Outcome: Progressing Goal: Respiratory complications will improve Outcome: Progressing Goal: Cardiovascular complication will be avoided Outcome: Progressing   Problem: Activity: Goal: Risk for activity intolerance will decrease Outcome: Progressing   Problem: Nutrition: Goal: Adequate nutrition will be maintained Outcome: Progressing   Problem: Coping: Goal: Level of anxiety will decrease Outcome: Progressing   Problem: Elimination: Goal: Will not experience complications related to bowel motility Outcome: Progressing Goal: Will not experience complications related to urinary retention Outcome: Progressing   Problem: Pain Management: Goal: General experience of comfort will improve Outcome: Progressing   Problem: Safety: Goal: Ability to remain free from injury will improve Outcome: Progressing   Problem: Skin Integrity: Goal: Risk for impaired skin integrity will  decrease Outcome: Progressing   Problem: Education: Goal: Ability to describe self-care measures that may prevent or decrease complications (Diabetes Survival Skills Education) will improve Outcome: Progressing Goal: Individualized Educational Video(s) Outcome: Progressing   Problem: Coping: Goal: Ability to adjust to condition or change in health will improve Outcome: Progressing   Problem: Fluid Volume: Goal: Ability to maintain a balanced intake and output will improve Outcome: Progressing   Problem: Health Behavior/Discharge Planning: Goal: Ability to identify and utilize available resources and services will improve Outcome: Progressing Goal: Ability to manage health-related needs will improve Outcome: Progressing   Problem: Metabolic: Goal: Ability to maintain appropriate glucose levels will improve Outcome: Progressing   Problem: Nutritional: Goal: Maintenance of adequate nutrition will improve Outcome: Progressing Goal: Progress toward achieving an optimal weight will improve Outcome: Progressing   Problem: Skin Integrity: Goal: Risk for impaired skin integrity will decrease Outcome: Progressing   Problem: Tissue Perfusion: Goal: Adequacy of tissue perfusion will improve Outcome: Progressing

## 2023-07-08 NOTE — Progress Notes (Signed)
 NAME:  Dana Donovan, MRN:  [id number], DOB:  Sep 27, 1960, LOS: 4 ADMISSION DATE:  07/04/2023, CONSULTATION DATE:  1/4 REFERRING MD:  Angelena, CHIEF COMPLAINT:  submassive PE    History of Present Illness:  63 year old female w/ h/o obesity, HTN, OSA,  Recent (+) COVID (Jun 30 2023). Treated w/ paxlovid  and Phenergan  for nausea. Brought in to ER on   w/ cc: increased shortness of breath, fatigue and then a syncopal episode on Jan 4 while sitting up in chair. EMS arrival sats 89-90% room air. Initial HR 140s, BP 133/88, sats 94% on 4 lpm.   Initial lab findings: d dimer > 20,  Na 131, CO2 20, cr 1.57, Trop I 174 up to  201, lactate > 4, BNP > 2000 CT angio saddle PE w/ large clot burden and RV/LV ration of 3 PESI score: 103  dexamethasone at a dose of 6 mg daily for 10 days  Pertinent  Medical History  Obesity (on Wegovy ), HTN, OSA Significant Hospital Events: Including procedures, antibiotic start and stop dates in addition to other pertinent events   Admitted s/p recent dx COVID 12/31. Treated Paxlovid . Comes in w/ submassive PE s/p syncopal episode. 1/2 dose Alteplase  administered in ER.  1/5 weaning O2. Hep gtt  1/7-pulmonary embolectomy  Interim History / Subjective:  Post embolectomy Has some pain around the shoulder but otherwise feels fine blood pressure better  Objective   Blood pressure (!) 149/75, pulse 89, temperature 98.4 F (36.9 C), temperature source Oral, resp. rate 18, height 5' (1.524 m), weight 93.7 kg, SpO2 100%.        Intake/Output Summary (Last 24 hours) at 07/08/2023 1031 Last data filed at 07/08/2023 0700 Gross per 24 hour  Intake 205.39 ml  Output 700 ml  Net -494.61 ml   Filed Weights   07/06/23 0331 07/07/23 0500 07/08/23 0500  Weight: 92.2 kg 90 kg 93.7 kg    Examination: General: Middle-age, does not appear to be in distress HEENT: Moist oral mucosa Neuro: Alert and oriented x 3 Chest: Coarse breath sounds bilaterally Heart: S1-S2  appreciated Abdomen: Soft, nontender, nondistended, bowel sounds present Skin: No rash   Resolved Hospital Problem list     Assessment & Plan:   Acute hypoxic respiratory failure with hypoxia Massive PE with cor pulmonale s/p lytics-provoked secondary to COVID infection S/p thrombectomy COVID infection s/p Paxlovid  Acute right lower extremity DVT -Echocardiogram with left ventricular function is normal, dilated right ventricle with reduced function -Hemodynamics are better present  AKI Lactic acidosis -Continue to monitor -Maintain renal perfusion   Obesity -Will continue to monitor  Continue oxygen supplementation  Best Practice (right click and Reselect all SmartList Selections daily)   Diet/type: Regular diet DVT prophylaxis systemic heparin  Pressure ulcer(s): N/A GI prophylaxis: N/A Lines: N/A Foley:  N/A Code Status:  full code Last date of multidisciplinary goals of care discussion [full code]  Labs   CBC: Recent Labs  Lab 07/04/23 1937 07/05/23 0125 07/06/23 0212 07/07/23 0229 07/08/23 0233  WBC 13.0* 14.9* 13.4* 14.2* 13.2*  HGB 13.1 13.1 10.8* 11.4* 11.6*  HCT 40.4 40.2 33.2* 34.6* 35.7*  MCV 85.6 84.3 83.8 83.0 85.0  PLT 131* 114* 153 187 181    Basic Metabolic Panel: Recent Labs  Lab 07/04/23 1937 07/05/23 0416 07/07/23 0229  NA 131* 136 135  K 3.6 3.8 4.0  CL 96* 102 102  CO2 20* 19* 23  GLUCOSE 235* 114* 125*  BUN 30* 28* 27*  CREATININE  1.57* 1.33* 1.11*  CALCIUM 8.7* 8.4* 8.7*   GFR: Estimated Creatinine Clearance (by C-G formula based on SCr of 1.11 mg/dL (H)) Female: 46.8 mL/min (A) Female: 65 mL/min (A) Recent Labs  Lab 07/04/23 2022 07/05/23 0125 07/05/23 0416 07/06/23 0212 07/07/23 0229 07/07/23 1047 07/08/23 0233  PROCALCITON <0.10  --   --   --   --   --   --   WBC  --  14.9*  --  13.4* 14.2*  --  13.2*  LATICACIDVEN 4.2* 4.3* 3.5*  --   --  2.2*  --     Liver Function Tests: Recent Labs  Lab  07/04/23 2022  AST 29  ALT 24  ALKPHOS 78  BILITOT 0.6  PROT 7.6  ALBUMIN 3.6   No results for input(s): LIPASE, AMYLASE in the last 168 hours. No results for input(s): AMMONIA in the last 168 hours.  ABG No results found for: PHART, PCO2ART, PO2ART, HCO3, TCO2, ACIDBASEDEF, O2SAT   Coagulation Profile: Recent Labs  Lab 07/04/23 2022 07/05/23 0125  INR 1.1 1.3*    Cardiac Enzymes: No results for input(s): CKTOTAL, CKMB, CKMBINDEX, TROPONINI in the last 168 hours.  HbA1C: Hgb A1c MFr Bld  Date/Time Value Ref Range Status  07/05/2023 01:25 AM 5.9 (H) 4.8 - 5.6 % Final    Comment:    (NOTE)         Prediabetes: 5.7 - 6.4         Diabetes: >6.4         Glycemic control for adults with diabetes: <7.0     CBG: Recent Labs  Lab 07/07/23 0612 07/07/23 1142 07/07/23 1727 07/07/23 2001 07/08/23 0755  GLUCAP 120* 93 76 79 74   Tallon Gertz, MD [facility] Pager: See Tracey

## 2023-07-08 NOTE — Progress Notes (Signed)
   07/08/23 1144  Spiritual Encounters  Type of Visit Initial  Care provided to: Patient  Referral source Family  Reason for visit Advance directives  OnCall Visit No   Visited with patient and provided HCPOA education and documents. Also provided spiritual care and reflective listening.

## 2023-07-09 ENCOUNTER — Other Ambulatory Visit (HOSPITAL_COMMUNITY): Payer: Self-pay

## 2023-07-09 ENCOUNTER — Telehealth: Payer: Self-pay

## 2023-07-09 DIAGNOSIS — I2699 Other pulmonary embolism without acute cor pulmonale: Secondary | ICD-10-CM | POA: Diagnosis not present

## 2023-07-09 DIAGNOSIS — I82409 Acute embolism and thrombosis of unspecified deep veins of unspecified lower extremity: Secondary | ICD-10-CM

## 2023-07-09 DIAGNOSIS — D649 Anemia, unspecified: Secondary | ICD-10-CM

## 2023-07-09 LAB — CULTURE, BLOOD (ROUTINE X 2)
Culture: NO GROWTH
Culture: NO GROWTH

## 2023-07-09 LAB — CBC
HCT: 34.7 % — ABNORMAL LOW (ref 36.0–46.0)
Hemoglobin: 11.5 g/dL — ABNORMAL LOW (ref 12.0–15.0)
MCH: 27.8 pg (ref 26.0–34.0)
MCHC: 33.1 g/dL (ref 30.0–36.0)
MCV: 83.8 fL (ref 80.0–100.0)
Platelets: 188 10*3/uL (ref 150–400)
RBC: 4.14 MIL/uL (ref 3.87–5.11)
RDW: 15 % (ref 11.5–15.5)
WBC: 11.3 10*3/uL — ABNORMAL HIGH (ref 4.0–10.5)
nRBC: 0.4 % — ABNORMAL HIGH (ref 0.0–0.2)

## 2023-07-09 LAB — BASIC METABOLIC PANEL
Anion gap: 9 (ref 5–15)
BUN: 24 mg/dL — ABNORMAL HIGH (ref 8–23)
CO2: 23 mmol/L (ref 22–32)
Calcium: 8.7 mg/dL — ABNORMAL LOW (ref 8.9–10.3)
Chloride: 104 mmol/L (ref 98–111)
Creatinine, Ser: 1.06 mg/dL — ABNORMAL HIGH (ref 0.44–1.00)
GFR, Estimated: 59 mL/min — ABNORMAL LOW (ref >60)
Glucose, Bld: 79 mg/dL (ref 70–99)
Potassium: 3.7 mmol/L (ref 3.5–5.1)
Sodium: 136 mmol/L (ref 135–145)

## 2023-07-09 LAB — GLUCOSE, CAPILLARY
Glucose-Capillary: 108 mg/dL — ABNORMAL HIGH (ref 70–99)
Glucose-Capillary: 121 mg/dL — ABNORMAL HIGH (ref 70–99)
Glucose-Capillary: 122 mg/dL — ABNORMAL HIGH (ref 70–99)
Glucose-Capillary: 105 mg/dL — ABNORMAL HIGH (ref 70–99)

## 2023-07-09 LAB — HEPARIN LEVEL (UNFRACTIONATED)
Heparin Unfractionated: 0.22 [IU]/mL — ABNORMAL LOW (ref 0.30–0.70)
Heparin Unfractionated: 0.29 [IU]/mL — ABNORMAL LOW (ref 0.30–0.70)

## 2023-07-09 NOTE — Progress Notes (Addendum)
 Progress Note    Dana Donovan   FMW:[id number]  DOB: Aug 24, 1960  DOA: 07/04/2023     5 PCP: Elliot Charm, MD  Initial CC: SOB  Hospital Course: Ms. Dana Donovan is a 63 year old female w/ h/o obesity, HTN, OSA,  Recent (+) COVID (Jun 30 2023). Treated w/ paxlovid  and Phenergan  for nausea. Brought in to ER on 07/04/23 with SOB, fatigue, and syncope sitting up in chair.  She was found to be mildly hypoxic, 89% on room air with tachycardia and normal blood pressure. She was placed on oxygen and admitted for further workup. D-dimer excessively elevated.  CT angio chest showed extensive bilateral PE with evidence of saddle component centrally. Lower extremity duplex also performed noting bilateral extensive DVT extending from the peroneal veins up to the left external and common iliac veins and IVC. She was treated with tPA and underwent thrombectomy with IR on 07/07/2023. Vascular surgery was also consulted for evaluation regarding the lower extremity DVTs.  Interval History:  No events overnight.  Feels much improved in general.  Assessment and Plan: * Acute massive pulmonary embolism (HCC) - Etiology suspected due to recent COVID infection.  First clot of lifetime - right heart strain noted on echo as well; needs repeat echo in ~ 6 months  - S/p tPA and underwent thrombectomy on 07/07/2023 - Continue heparin  drip - Eventual transition to Eliquis  once no further procedures.  Follow-up vascular surgery evaluation - Patient endorses upcoming road trip in March.  Will need to clarify if/when she's allowed to travel in car for long trips  Acute hypoxemic respiratory failure (HCC) - Due to underlying massive bilateral PE - Underwent treatment with tPA and thrombectomy on 07/07/2023 - Now on room air  DVT (deep venous thrombosis) (HCC) - see PE workup - follow up vascular evaluation; clots extend to IVC  AKI (acute kidney injury) (HCC) - baseline creatinine presumed  normal; no prior to compare -Creatinine peaked at 1.57 and has been steadily downtrending   Hyponatremia with decreased serum osmolality-resolved as of 07/09/2023 - improved  COVID-19 virus detected - Initially + 06/30/2023 - Still positive on admission - Continue precautions  Normocytic anemia - No obvious signs of bleeding.  Increased risk with recent tPA and thrombectomy - Prior hemoglobin around 13 g/dL - Has been stable around 11 to 12 g/dL - Continue trending CBC  Prediabetes - A1c 5.9% on 07/05/2023 - Continue sliding scale insulin   Class 2 obesity due to excess calories with body mass index (BMI) of 38.0 to 38.9 in adult - Complicates overall prognosis and care - Body mass index is 40.39 kg/m.  History of hypertension - Continue Maxide HCT  Troponin level elevated - demand from PE strain    Old records reviewed in assessment of this patient  Antimicrobials:   DVT prophylaxis:     Code Status:   Code Status: Full Code  Mobility Assessment (Last 72 Hours)     Mobility Assessment     Row Name 07/09/23 0914 07/09/23 0000 07/08/23 0800 07/07/23 0850 07/06/23 1932   Does patient have an order for bedrest or is patient medically unstable No - Continue assessment No - Continue assessment No - Continue assessment No - Continue assessment No - Continue assessment   What is the highest level of mobility based on the progressive mobility assessment? Level 5 (Walks with assist in room/hall) - Balance while stepping forward/back and can walk in room with assist - Complete Level 5 (Walks with assist in room/hall) -  Balance while stepping forward/back and can walk in room with assist - Complete Level 5 (Walks with assist in room/hall) - Balance while stepping forward/back and can walk in room with assist - Complete Level 4 (Walks with assist in room) - Balance while marching in place and cannot step forward and back - Complete Level 4 (Walks with assist in room) - Balance while  marching in place and cannot step forward and back - Complete   Is the above level different from baseline mobility prior to current illness? Yes - Recommend PT order Yes - Recommend PT order -- Yes - Recommend PT order Yes - Recommend PT order    Row Name 07/06/23 1109           What is the highest level of mobility based on the progressive mobility assessment? Level 4 (Walks with assist in room) - Balance while marching in place and cannot step forward and back - Complete                Barriers to discharge: none Disposition Plan:  Home HH orders placed: TBD Status is: Inpt  Objective: Blood pressure (!) 161/90, pulse 73, temperature 98.2 F (36.8 C), temperature source Oral, resp. rate 18, height 5' (1.524 m), weight 93.8 kg, SpO2 100%.  Examination:  Physical Exam Constitutional:      Appearance: Normal appearance. She is obese.  HENT:     Head: Normocephalic and atraumatic.     Mouth/Throat:     Mouth: Mucous membranes are moist.  Eyes:     Extraocular Movements: Extraocular movements intact.  Cardiovascular:     Rate and Rhythm: Normal rate and regular rhythm.  Pulmonary:     Effort: Pulmonary effort is normal. No respiratory distress.     Breath sounds: Normal breath sounds. No wheezing.  Abdominal:     General: Bowel sounds are normal. There is no distension.     Palpations: Abdomen is soft.     Tenderness: There is no abdominal tenderness.  Musculoskeletal:        General: Normal range of motion.     Cervical back: Normal range of motion and neck supple.  Skin:    General: Skin is warm and dry.  Neurological:     General: No focal deficit present.     Mental Status: She is alert.  Psychiatric:        Mood and Affect: Mood normal.      Consultants:  IR Pulm Vascular   Procedures:    Data Reviewed: Results for orders placed or performed during the hospital encounter of 07/04/23 (from the past 24 hours)  Glucose, capillary     Status: None    Collection Time: 07/08/23 12:14 PM  Result Value Ref Range   Glucose-Capillary 78 70 - 99 mg/dL  Glucose, capillary     Status: None   Collection Time: 07/08/23  4:04 PM  Result Value Ref Range   Glucose-Capillary 86 70 - 99 mg/dL  Heparin  level (unfractionated)     Status: Abnormal   Collection Time: 07/09/23  6:14 AM  Result Value Ref Range   Heparin  Unfractionated 0.22 (L) 0.30 - 0.70 IU/mL  CBC     Status: Abnormal   Collection Time: 07/09/23  6:14 AM  Result Value Ref Range   WBC 11.3 (H) 4.0 - 10.5 K/uL   RBC 4.14 3.87 - 5.11 MIL/uL   Hemoglobin 11.5 (L) 12.0 - 15.0 g/dL   HCT 65.2 (L) 63.9 - 53.9 %  MCV 83.8 80.0 - 100.0 fL   MCH 27.8 26.0 - 34.0 pg   MCHC 33.1 30.0 - 36.0 g/dL   RDW 84.9 88.4 - 84.4 %   Platelets 188 150 - 400 K/uL   nRBC 0.4 (H) 0.0 - 0.2 %  Basic metabolic panel     Status: Abnormal   Collection Time: 07/09/23  6:14 AM  Result Value Ref Range   Sodium 136 135 - 145 mmol/L   Potassium 3.7 3.5 - 5.1 mmol/L   Chloride 104 98 - 111 mmol/L   CO2 23 22 - 32 mmol/L   Glucose, Bld 79 70 - 99 mg/dL   BUN 24 (H) 8 - 23 mg/dL   Creatinine, Ser 8.93 (H) 0.44 - 1.00 mg/dL   Calcium 8.7 (L) 8.9 - 10.3 mg/dL   GFR, Estimated 59 (L) >60 mL/min   Anion gap 9 5 - 15  Glucose, capillary     Status: Abnormal   Collection Time: 07/09/23  9:05 AM  Result Value Ref Range   Glucose-Capillary 108 (H) 70 - 99 mg/dL   Comment 1 Document in Chart     I have reviewed pertinent nursing notes, vitals, labs, and images as necessary. I have ordered labwork to follow up on as indicated.  I have reviewed the last notes from staff over past 24 hours. I have discussed patient's care plan and test results with nursing staff, CM/SW, and other staff as appropriate.  Time spent: Greater than 50% of the 55 minute visit was spent in counseling/coordination of care for the patient as laid out in the A&P.   LOS: 5 days   Alm Apo, MD [facility] 07/09/2023, 10:59 AM

## 2023-07-09 NOTE — Assessment & Plan Note (Signed)
-   baseline creatinine presumed normal; no prior to compare -Creatinine peaked at 1.57 and has been steadily downtrending

## 2023-07-09 NOTE — Plan of Care (Signed)
  Problem: Education: Goal: Knowledge of risk factors and measures for prevention of condition will improve Outcome: Progressing   Problem: Coping: Goal: Psychosocial and spiritual needs will be supported Outcome: Progressing   Problem: Respiratory: Goal: Will maintain a patent airway Outcome: Progressing Goal: Complications related to the disease process, condition or treatment will be avoided or minimized Outcome: Progressing   Problem: Education: Goal: Knowledge of General Education information will improve Description: Including pain rating scale, medication(s)/side effects and non-pharmacologic comfort measures Outcome: Progressing   Problem: Health Behavior/Discharge Planning: Goal: Ability to manage health-related needs will improve Outcome: Progressing   Problem: Clinical Measurements: Goal: Ability to maintain clinical measurements within normal limits will improve Outcome: Progressing Goal: Will remain free from infection Outcome: Progressing Goal: Diagnostic test results will improve Outcome: Progressing Goal: Respiratory complications will improve Outcome: Progressing Goal: Cardiovascular complication will be avoided Outcome: Progressing   Problem: Activity: Goal: Risk for activity intolerance will decrease Outcome: Progressing   Problem: Nutrition: Goal: Adequate nutrition will be maintained Outcome: Progressing   Problem: Coping: Goal: Level of anxiety will decrease Outcome: Progressing   Problem: Elimination: Goal: Will not experience complications related to bowel motility Outcome: Progressing Goal: Will not experience complications related to urinary retention Outcome: Progressing   Problem: Pain Management: Goal: General experience of comfort will improve Outcome: Progressing   Problem: Skin Integrity: Goal: Risk for impaired skin integrity will decrease Outcome: Progressing   Problem: Education: Goal: Ability to describe self-care  measures that may prevent or decrease complications (Diabetes Survival Skills Education) will improve Outcome: Progressing Goal: Individualized Educational Video(s) Outcome: Progressing   Problem: Coping: Goal: Ability to adjust to condition or change in health will improve Outcome: Progressing   Problem: Fluid Volume: Goal: Ability to maintain a balanced intake and output will improve Outcome: Progressing   Problem: Health Behavior/Discharge Planning: Goal: Ability to identify and utilize available resources and services will improve Outcome: Progressing Goal: Ability to manage health-related needs will improve Outcome: Progressing   Problem: Metabolic: Goal: Ability to maintain appropriate glucose levels will improve Outcome: Progressing   Problem: Nutritional: Goal: Maintenance of adequate nutrition will improve Outcome: Progressing Goal: Progress toward achieving an optimal weight will improve Outcome: Progressing   Problem: Skin Integrity: Goal: Risk for impaired skin integrity will decrease Outcome: Progressing   Problem: Tissue Perfusion: Goal: Adequacy of tissue perfusion will improve Outcome: Progressing

## 2023-07-09 NOTE — Assessment & Plan Note (Signed)
-   Complicates overall prognosis and care - Body mass index is 40.39 kg/m.

## 2023-07-09 NOTE — Progress Notes (Signed)
 Mobility Specialist Progress Note:    07/09/23 1445  Mobility  Activity Ambulated with assistance to bathroom  Level of Assistance Standby assist, set-up cues, supervision of patient - no hands on  Assistive Device Front wheel walker  Distance Ambulated (ft) 12 ft  Activity Response Tolerated well  Mobility Referral Yes  Mobility visit 1 Mobility  Mobility Specialist Start Time (ACUTE ONLY) 1435  Mobility Specialist Stop Time (ACUTE ONLY) 1445  Mobility Specialist Time Calculation (min) (ACUTE ONLY) 10 min   Pt received in bed, requesting assistance to ambulate to bathroom. Required SBA with RW. Tolerated well, asx throughout. Left pt in bathroom, encouraged to pull call light when finished. Nursing notified.   Shaka Zech Mobility Specialist Please contact via Special Educational Needs Teacher or  Rehab office at [phone]

## 2023-07-09 NOTE — Assessment & Plan Note (Signed)
-   No obvious signs of bleeding.  Increased risk with recent tPA and thrombectomy - Prior hemoglobin around 13 g/dL - Has been stable around 11 to 12 g/dL - Continue trending CBC

## 2023-07-09 NOTE — Telephone Encounter (Signed)
 Pharmacy Patient Advocate Encounter  Insurance verification completed.    The patient is insured through [facility]. Patient has [employer], may use a copay card, and/or apply for patient assistance if available.    Ran test claim for Eliquis  5mg  and the current 30 day co-pay is $20.   This test claim was processed through [facility]- copay amounts may vary at other pharmacies due to [employer], or as the patient moves through the different stages of their insurance plan.

## 2023-07-09 NOTE — Plan of Care (Signed)
 Problem: Education: Goal: Knowledge of risk factors and measures for prevention of condition will improve 07/09/2023 2345 by Feliciano Sherleen BRAVO, RN Outcome: Progressing 07/09/2023 2344 by Feliciano Sherleen BRAVO, RN Outcome: Progressing   Problem: Coping: Goal: Psychosocial and spiritual needs will be supported 07/09/2023 2345 by Feliciano Sherleen BRAVO, RN Outcome: Progressing 07/09/2023 2344 by Feliciano Sherleen BRAVO, RN Outcome: Progressing   Problem: Respiratory: Goal: Will maintain a patent airway 07/09/2023 2345 by Feliciano Sherleen BRAVO, RN Outcome: Progressing 07/09/2023 2344 by Feliciano Sherleen BRAVO, RN Outcome: Progressing Goal: Complications related to the disease process, condition or treatment will be avoided or minimized 07/09/2023 2345 by Feliciano Sherleen BRAVO, RN Outcome: Progressing 07/09/2023 2344 by Feliciano Sherleen BRAVO, RN Outcome: Progressing   Problem: Education: Goal: Knowledge of General Education information will improve Description: Including pain rating scale, medication(s)/side effects and non-pharmacologic comfort measures 07/09/2023 2345 by Feliciano Sherleen BRAVO, RN Outcome: Progressing 07/09/2023 2344 by Feliciano Sherleen BRAVO, RN Outcome: Progressing   Problem: Health Behavior/Discharge Planning: Goal: Ability to manage health-related needs will improve 07/09/2023 2345 by Feliciano Sherleen BRAVO, RN Outcome: Progressing 07/09/2023 2344 by Feliciano Sherleen BRAVO, RN Outcome: Progressing   Problem: Clinical Measurements: Goal: Ability to maintain clinical measurements within normal limits will improve 07/09/2023 2345 by Feliciano Sherleen BRAVO, RN Outcome: Progressing 07/09/2023 2344 by Feliciano Sherleen BRAVO, RN Outcome: Progressing Goal: Will remain free from infection 07/09/2023 2345 by Feliciano Sherleen BRAVO, RN Outcome: Progressing 07/09/2023 2344 by Feliciano Sherleen BRAVO, RN Outcome: Progressing Goal: Diagnostic test results will improve 07/09/2023 2345 by Feliciano Sherleen BRAVO, RN Outcome: Progressing 07/09/2023 2344 by Feliciano Sherleen BRAVO, RN Outcome:  Progressing Goal: Respiratory complications will improve 07/09/2023 2345 by Feliciano Sherleen BRAVO, RN Outcome: Progressing 07/09/2023 2344 by Feliciano Sherleen BRAVO, RN Outcome: Progressing Goal: Cardiovascular complication will be avoided 07/09/2023 2345 by Feliciano Sherleen BRAVO, RN Outcome: Progressing 07/09/2023 2344 by Feliciano Sherleen BRAVO, RN Outcome: Progressing   Problem: Activity: Goal: Risk for activity intolerance will decrease 07/09/2023 2345 by Feliciano Sherleen BRAVO, RN Outcome: Progressing 07/09/2023 2344 by Feliciano Sherleen BRAVO, RN Outcome: Progressing   Problem: Nutrition: Goal: Adequate nutrition will be maintained 07/09/2023 2345 by Feliciano Sherleen BRAVO, RN Outcome: Progressing 07/09/2023 2344 by Feliciano Sherleen BRAVO, RN Outcome: Progressing   Problem: Coping: Goal: Level of anxiety will decrease 07/09/2023 2345 by Feliciano Sherleen BRAVO, RN Outcome: Progressing 07/09/2023 2344 by Feliciano Sherleen BRAVO, RN Outcome: Progressing   Problem: Elimination: Goal: Will not experience complications related to bowel motility 07/09/2023 2345 by Feliciano Sherleen BRAVO, RN Outcome: Progressing 07/09/2023 2344 by Feliciano Sherleen BRAVO, RN Outcome: Progressing Goal: Will not experience complications related to urinary retention 07/09/2023 2345 by Feliciano Sherleen BRAVO, RN Outcome: Progressing 07/09/2023 2344 by Feliciano Sherleen BRAVO, RN Outcome: Progressing   Problem: Pain Management: Goal: General experience of comfort will improve 07/09/2023 2345 by Feliciano Sherleen BRAVO, RN Outcome: Progressing 07/09/2023 2344 by Feliciano Sherleen BRAVO, RN Outcome: Progressing   Problem: Safety: Goal: Ability to remain free from injury will improve 07/09/2023 2345 by Feliciano Sherleen BRAVO, RN Outcome: Progressing 07/09/2023 2344 by Feliciano Sherleen BRAVO, RN Outcome: Progressing   Problem: Skin Integrity: Goal: Risk for impaired skin integrity will decrease 07/09/2023 2345 by Feliciano Sherleen BRAVO, RN Outcome: Progressing 07/09/2023 2344 by Feliciano Sherleen BRAVO, RN Outcome: Progressing   Problem:  Education: Goal: Ability to describe self-care measures that may prevent or decrease complications (Diabetes Survival Skills Education) will improve 07/09/2023 2345 by Feliciano Sherleen BRAVO, RN Outcome: Progressing 07/09/2023 2344 by Feliciano Sherleen BRAVO,  RN Outcome: Progressing Goal: Individualized Educational Video(s) 07/09/2023 2345 by Feliciano Sherleen BRAVO, RN Outcome: Progressing 07/09/2023 2344 by Feliciano Sherleen BRAVO, RN Outcome: Progressing   Problem: Coping: Goal: Ability to adjust to condition or change in health will improve 07/09/2023 2345 by Feliciano Sherleen BRAVO, RN Outcome: Progressing 07/09/2023 2344 by Feliciano Sherleen BRAVO, RN Outcome: Progressing   Problem: Fluid Volume: Goal: Ability to maintain a balanced intake and output will improve 07/09/2023 2345 by Feliciano Sherleen BRAVO, RN Outcome: Progressing 07/09/2023 2344 by Feliciano Sherleen BRAVO, RN Outcome: Progressing   Problem: Health Behavior/Discharge Planning: Goal: Ability to identify and utilize available resources and services will improve 07/09/2023 2345 by Feliciano Sherleen BRAVO, RN Outcome: Progressing 07/09/2023 2344 by Feliciano Sherleen BRAVO, RN Outcome: Progressing Goal: Ability to manage health-related needs will improve 07/09/2023 2345 by Feliciano Sherleen BRAVO, RN Outcome: Progressing 07/09/2023 2344 by Feliciano Sherleen BRAVO, RN Outcome: Progressing   Problem: Metabolic: Goal: Ability to maintain appropriate glucose levels will improve 07/09/2023 2345 by Feliciano Sherleen BRAVO, RN Outcome: Progressing 07/09/2023 2344 by Feliciano Sherleen BRAVO, RN Outcome: Progressing   Problem: Nutritional: Goal: Maintenance of adequate nutrition will improve 07/09/2023 2345 by Feliciano Sherleen BRAVO, RN Outcome: Progressing 07/09/2023 2344 by Feliciano Sherleen BRAVO, RN Outcome: Progressing Goal: Progress toward achieving an optimal weight will improve 07/09/2023 2345 by Feliciano Sherleen BRAVO, RN Outcome: Progressing 07/09/2023 2344 by Feliciano Sherleen BRAVO, RN Outcome: Progressing   Problem: Skin Integrity: Goal: Risk  for impaired skin integrity will decrease 07/09/2023 2345 by Feliciano Sherleen BRAVO, RN Outcome: Progressing 07/09/2023 2344 by Feliciano Sherleen BRAVO, RN Outcome: Progressing   Problem: Tissue Perfusion: Goal: Adequacy of tissue perfusion will improve 07/09/2023 2345 by Feliciano Sherleen BRAVO, RN Outcome: Progressing 07/09/2023 2344 by Feliciano Sherleen BRAVO, RN Outcome: Progressing

## 2023-07-09 NOTE — Assessment & Plan Note (Signed)
-   Continue Maxide HCT

## 2023-07-09 NOTE — Assessment & Plan Note (Signed)
 improved

## 2023-07-09 NOTE — Assessment & Plan Note (Signed)
-   A1c 5.9% on 07/05/2023 - Continue sliding scale insulin

## 2023-07-09 NOTE — Assessment & Plan Note (Signed)
-   Initially + 06/30/2023 - Still positive on admission - Continue precautions

## 2023-07-09 NOTE — Plan of Care (Signed)
  Problem: Education: Goal: Knowledge of risk factors and measures for prevention of condition will improve Outcome: Progressing   Problem: Coping: Goal: Psychosocial and spiritual needs will be supported Outcome: Progressing   Problem: Respiratory: Goal: Will maintain a patent airway Outcome: Progressing Goal: Complications related to the disease process, condition or treatment will be avoided or minimized Outcome: Progressing   Problem: Education: Goal: Knowledge of General Education information will improve Description: Including pain rating scale, medication(s)/side effects and non-pharmacologic comfort measures Outcome: Progressing   Problem: Health Behavior/Discharge Planning: Goal: Ability to manage health-related needs will improve Outcome: Progressing   Problem: Clinical Measurements: Goal: Ability to maintain clinical measurements within normal limits will improve Outcome: Progressing Goal: Will remain free from infection Outcome: Progressing Goal: Diagnostic test results will improve Outcome: Progressing Goal: Respiratory complications will improve Outcome: Progressing Goal: Cardiovascular complication will be avoided Outcome: Progressing   Problem: Activity: Goal: Risk for activity intolerance will decrease Outcome: Progressing   Problem: Nutrition: Goal: Adequate nutrition will be maintained Outcome: Progressing   Problem: Coping: Goal: Level of anxiety will decrease Outcome: Progressing   Problem: Elimination: Goal: Will not experience complications related to bowel motility Outcome: Progressing Goal: Will not experience complications related to urinary retention Outcome: Progressing   Problem: Pain Management: Goal: General experience of comfort will improve Outcome: Progressing   Problem: Safety: Goal: Ability to remain free from injury will improve Outcome: Progressing   Problem: Skin Integrity: Goal: Risk for impaired skin integrity will  decrease Outcome: Progressing   Problem: Education: Goal: Ability to describe self-care measures that may prevent or decrease complications (Diabetes Survival Skills Education) will improve Outcome: Progressing Goal: Individualized Educational Video(s) Outcome: Progressing   Problem: Coping: Goal: Ability to adjust to condition or change in health will improve Outcome: Progressing   Problem: Fluid Volume: Goal: Ability to maintain a balanced intake and output will improve Outcome: Progressing   Problem: Health Behavior/Discharge Planning: Goal: Ability to identify and utilize available resources and services will improve Outcome: Progressing Goal: Ability to manage health-related needs will improve Outcome: Progressing   Problem: Metabolic: Goal: Ability to maintain appropriate glucose levels will improve Outcome: Progressing   Problem: Nutritional: Goal: Maintenance of adequate nutrition will improve Outcome: Progressing Goal: Progress toward achieving an optimal weight will improve Outcome: Progressing   Problem: Skin Integrity: Goal: Risk for impaired skin integrity will decrease Outcome: Progressing   Problem: Tissue Perfusion: Goal: Adequacy of tissue perfusion will improve Outcome: Progressing

## 2023-07-09 NOTE — Progress Notes (Signed)
 PHARMACY - ANTICOAGULATION CONSULT NOTE  Pharmacy Consult for heparin  Indication: pulmonary embolus  No Known Allergies  Patient Measurements: Height: 5' (152.4 cm) Weight: 93.8 kg (206 lb 12.7 oz) IBW/kg (Calculated) : 45.5 Heparin  Dosing Weight: 70kg  Vital Signs: Temp: 98.6 F (37 C) (01/09 1718) Temp Source: Oral (01/09 1718) BP: 144/71 (01/09 1718) Pulse Rate: 97 (01/09 1718)  Labs: Recent Labs    07/07/23 0229 07/08/23 0233 07/09/23 0614 07/09/23 1726  HGB 11.4* 11.6* 11.5*  --   HCT 34.6* 35.7* 34.7*  --   PLT 187 181 188  --   HEPARINUNFRC 0.35 0.53 0.22* 0.29*  CREATININE 1.11*  --  1.06*  --     Estimated Creatinine Clearance (by C-G formula based on SCr of 1.06 mg/dL (H)) Female: 44.3 mL/min (A) Female: 68.1 mL/min (A)   Assessment: 38 YOF c/o worsening SOB despite antiviral tx for Covid, EMS reports O2 89-90% on RA and was at 93-94% on 4L, found at [facility] ED to have BLL PE w/ RHS >> tPA was given and pt tx'd to [facility] MICU for further treatment and now on IV heparin . Doppler noted with bilateral DVT  Heparin  level 0.29, remains subtherapeutic despite rate increase to 1050 units/hr. No issues with the infusion or bleeding reported per RN.  Goal of Therapy:  Heparin  level 0.3-0.7 units/ml Monitor platelets by anticoagulation protocol: Yes   Plan:  Increase heparin  gtt to 1150 units/hr Recheck heparin  level w/ AM labs Daily heparin  level and CBC F/u transition to oral AC when able  Thank you Rocky Slade, PharmD, BCPS 07/09/2023, 6:49 PM  Please check AMION for all [facility] Pharmacy phone numbers After 10:00 PM, call Main Pharmacy [phone]

## 2023-07-09 NOTE — Assessment & Plan Note (Signed)
-   demand from PE strain

## 2023-07-09 NOTE — Assessment & Plan Note (Signed)
-   see PE workup - follow up vascular evaluation; clots extend to IVC

## 2023-07-09 NOTE — Assessment & Plan Note (Addendum)
-   Etiology suspected due to recent COVID infection.  First clot of lifetime - right heart strain noted on echo as well; needs repeat echo in ~ 6 months  - S/p tPA and underwent thrombectomy on 07/07/2023 - Continue heparin  drip - Eventual transition to Eliquis  once no further procedures.  Follow-up vascular surgery evaluation - Patient endorses upcoming road trip in March.  Will need to clarify if/when she's allowed to travel in car for long trips

## 2023-07-09 NOTE — Consult Note (Signed)
 [facility]ist of [facility]  Patient name: Dana Donovan MRN: [id number] DOB: 10/23/1960 Sex: adult   REQUESTING PROVIDER:   Hospitalist   REASON FOR CONSULT:    DVT  HISTORY OF PRESENT ILLNESS:   Dana Donovan is a 63 y.o. adult, who was admitted on 07/04/2023 with a massive saddle pulmonary embolus in the setting of COVID.  She was diagnosed on 1231.  She was treated with Paxlovid .  She received half dose of systemic tPA.  She continued to have issues with shortness of breath.  A repeat CT scan showed residual significant thrombus and so she was taken to interventional radiology for mechanical thrombectomy.  Her symptoms did improve.  She is now off oxygen.  During her workup she was found to have bilateral DVTs.  The patient is very active outside of the hospital.  She exercises regularly.  Currently, she does not endorse any significant leg swelling.  Past medical history is significant for obesity, on Wegovy , hypertension and sleep apnea.  PAST MEDICAL HISTORY    History reviewed. No pertinent past medical history.   FAMILY HISTORY   Family History  Problem Relation Age of Onset   Breast cancer Neg Hx     SOCIAL HISTORY:   Social History   Socioeconomic History   Marital status: Married    Spouse name: Not on file   Number of children: Not on file   Years of education: Not on file   Highest education level: Not on file  Occupational History   Not on file  Tobacco Use   Smoking status: Never   Smokeless tobacco: Never  Substance and Sexual Activity   Alcohol use: Never   Drug use: Never   Sexual activity: Not on file  Other Topics Concern   Not on file  Social History Narrative   Not on file   Social Drivers of Health   Financial Resource Strain: Not on file  Food Insecurity: No Food Insecurity (07/06/2023)   Hunger Vital Sign    Worried About Running Out of Food in the Last Year: Never true     Ran Out of Food in the Last Year: Never true  Transportation Needs: No Transportation Needs (07/06/2023)   PRAPARE - Administrator, Civil Service (Medical): No    Lack of Transportation (Non-Medical): No  Physical Activity: Not on file  Stress: Not on file  Social Connections: Not on file  Intimate Partner Violence: Not At Risk (07/06/2023)   Humiliation, Afraid, Rape, and Kick questionnaire    Fear of Current or Ex-Partner: No    Emotionally Abused: No    Physically Abused: No    Sexually Abused: No    ALLERGIES:    No Known Allergies  CURRENT MEDICATIONS:    Current Facility-Administered Medications  Medication Dose Route Frequency Provider Last Rate Last Admin   acetaminophen  (TYLENOL ) tablet 1,000 mg  1,000 mg Oral Q6H PRN Olalere, Adewale A, MD   1,000 mg at 07/08/23 2247   albuterol  (PROVENTIL ) (2.5 MG/3ML) 0.083% nebulizer solution 2.5 mg  2.5 mg Nebulization Q6H PRN Babcock, Peter E, NP   2.5 mg at 07/06/23 2056   Chlorhexidine  Gluconate Cloth 2 % PADS 6 each  6 each Topical Daily Claudene Toribio BROCKS, MD   6 each at 07/09/23 0914   docusate sodium  (COLACE) capsule 100 mg  100 mg Oral BID PRN Kara Dorn NOVAK, MD       feeding supplement (ENSURE ENLIVE / ENSURE  PLUS) liquid 237 mL  237 mL Oral BID BM Fairy Frames, MD   237 mL at 07/09/23 0914   fluticasone  (FLONASE ) 50 MCG/ACT nasal spray 2 spray  2 spray Each Nare Daily Jenna Maude BRAVO, NP   2 spray at 07/09/23 9083   heparin  ADULT infusion 100 units/mL (25000 units/254mL)  1,050 Units/hr Intravenous Continuous Patsy Lenis, MD 10.5 mL/hr at 07/09/23 0917 1,050 Units/hr at 07/09/23 9082   hydrALAZINE  (APRESOLINE ) injection 10 mg  10 mg Intravenous Q4H PRN Haze Led, MD   10 mg at 07/08/23 9070   insulin  aspart (novoLOG ) injection 0-15 Units  0-15 Units Subcutaneous TID WC Jenna Maude BRAVO, NP   2 Units at 07/09/23 1256   Oral care mouth rinse  15 mL Mouth Rinse PRN Fairy Frames, MD       oxybutynin   (DITROPAN -XL) 24 hr tablet 10 mg  10 mg Oral QHS Babcock, Peter E, NP   10 mg at 07/08/23 2206   polyethylene glycol (MIRALAX  / GLYCOLAX ) packet 17 g  17 g Oral Daily PRN Kara Dorn NOVAK, MD       triamterene -hydrochlorothiazide  (MAXZIDE ) 75-50 MG per tablet 1 tablet  1 tablet Oral Daily Olalere, Adewale A, MD   1 tablet at 07/09/23 0916    REVIEW OF SYSTEMS:   [X]  denotes positive finding, [ ]  denotes negative finding Cardiac  Comments:  Chest pain or chest pressure:    Shortness of breath upon exertion: x   Short of breath when lying flat: x   Irregular heart rhythm:        Vascular    Pain in calf, thigh, or hip brought on by ambulation:    Pain in feet at night that wakes you up from your sleep:     Blood clot in your veins:    Leg swelling:         Pulmonary    Oxygen at home:    Productive cough:     Wheezing:         Neurologic    Sudden weakness in arms or legs:     Sudden numbness in arms or legs:     Sudden onset of difficulty speaking or slurred speech:    Temporary loss of vision in one eye:     Problems with dizziness:         Gastrointestinal    Blood in stool:      Vomited blood:         Genitourinary    Burning when urinating:     Blood in urine:        Psychiatric    Major depression:         Hematologic    Bleeding problems:    Problems with blood clotting too easily:        Skin    Rashes or ulcers:        Constitutional    Fever or chills:     PHYSICAL EXAM:   Vitals:   07/08/23 2349 07/09/23 0348 07/09/23 0450 07/09/23 0902  BP: (!) 168/75 137/78  (!) 161/90  Pulse: 82 91  73  Resp: 16   18  Temp: 98.5 F (36.9 C) 98.1 F (36.7 C)  98.2 F (36.8 C)  TempSrc: Oral Oral  Oral  SpO2: 97% 96%  100%  Weight:   93.8 kg   Height:        GENERAL: The patient is a well-nourished adult, in no acute distress. The  vital signs are documented above. CARDIAC: There is a regular rate and rhythm.  VASCULAR: No significant lower extremity  edema PULMONARY: Nonlabored respirations ABDOMEN: Soft and non-tender  MUSCULOSKELETAL: There are no major deformities or cyanosis. NEUROLOGIC: No focal weakness or paresthesias are detected. SKIN: There are no ulcers or rashes noted. PSYCHIATRIC: The patient has a normal affect.  STUDIES:   I have reviewed the following studies: CTA:  Significant for saddle pulmonary emboli  Venous Doppler: RIGHT:  - Findings consistent with acute deep vein thrombosis involving the right  femoral vein, right proximal profunda vein, right popliteal vein, right  posterior tibial veins, and right peroneal veins.  Findings consistent with acute intramuscular thrombosis involving the  right gastrocnemius veins.     - No cystic structure found in the popliteal fossa.  pulsatile waveforms noted    LEFT:  - Findings consistent with acute deep vein thrombosis involving the left  common femoral vein, SF junction, left femoral vein, left popliteal vein,  left posterior tibial veins, and left peroneal veins.  Findings consistent with acute intramuscular thrombosis involving the left  gastrocnemius veins.   Ilio caval ultrasound: IVC/Iliac: Partial acute thrombus noted throughout the left external and  common iliac veins and the IVC.   ASSESSMENT and PLAN   Bilateral DVT and PE: The patient has undergone pharmacologic and mechanical thrombectomy for saddle pulmonary embolism.  She is recovering nicely from this.  Her workup revealed bilateral DVT, with possible extension into the left iliac system and IVC.  Despite her extensive thrombus, she does not have significant lower extremity swelling.  On exam today I did not identify any significant edema.  Therefore, I would not recommend lower extremity intervention at this time.  However, the patient has not been ambulatory very much recently and so she potentially could develop leg swelling as she becomes more active.  I do not think that intervention is  necessary as long as she does not have edema however her symptoms could change over time as she increases her activity.  Therefore, I have told her that I would like for her to contact me within the week if she notices any significant swelling.  If that is the case I would recommend thrombectomy.  From my perspective this can all be complex as an outpatient.  She should be given my office number at discharge so that she can contact me if she develops a change in her symptoms.  My number is: [phone]   Malvina Serene CLORE, MD, FACS [facility] [phone] Pager [phone]

## 2023-07-09 NOTE — Assessment & Plan Note (Signed)
-   Due to underlying massive bilateral PE - Underwent treatment with tPA and thrombectomy on 07/07/2023 - Now on room air

## 2023-07-09 NOTE — Hospital Course (Addendum)
 Dana Donovan is a 63 year old female w/ h/o obesity, HTN, OSA,  Recent (+) COVID (Jun 30 2023). Treated w/ paxlovid  and Phenergan  for nausea. Brought in to ER on 07/04/23 with SOB, fatigue, and syncope sitting up in chair.  She was found to be mildly hypoxic, 89% on room air with tachycardia and normal blood pressure. She was placed on oxygen and admitted for further workup. D-dimer excessively elevated.  CT angio chest showed extensive bilateral PE with evidence of saddle component centrally. Lower extremity duplex also performed noting bilateral extensive DVT extending from the peroneal veins up to the left external and common iliac veins and IVC. She was treated with tPA and underwent thrombectomy with IR on 07/07/2023. Vascular surgery was also consulted for evaluation regarding the lower extremity DVTs.

## 2023-07-09 NOTE — Evaluation (Signed)
 Physical Therapy Evaluation Patient Details Name: Dana Donovan MRN: [id number] DOB: [birth year] Today's Date: 07/09/2023  History of Present Illness  Pt is a 63 y.o. female admitted 1/4. Covid+. CT revealed extensive bilat PE with evidence of saddle component centrally. US  revealed bilat extensive DVT. She received tPA and underwent thrombectomy 1/7. PMH:  obesity, HTN, OSA   Clinical Impression  Pt admitted with above diagnosis. PTA pt lived at home with her husband, active and independent. Pt currently with functional limitations due to the deficits listed below (see PT Problem List). On eval, she demo mod I bed mobility. Supervision transfers and amb 100' without AD. Mobilized on RA with SpO2 95%. Max HR 132, but primarily sustained in 120s. No SOB/DOE. Pt will benefit from acute skilled PT to increase their independence and safety with mobility to allow discharge. PT to follow acutely. No follow up services or DME needed.          If plan is discharge home, recommend the following: Assist for transportation;Assistance with cooking/housework   Can travel by private vehicle        Equipment Recommendations None recommended by PT  Recommendations for Other Services       Functional Status Assessment Patient has had a recent decline in their functional status and demonstrates the ability to make significant improvements in function in a reasonable and predictable amount of time.     Precautions / Restrictions Precautions Precautions: Other (comment) Precaution Comments: watch HR and O2 Restrictions Weight Bearing Restrictions Per Provider Order: No      Mobility  Bed Mobility Overal bed mobility: Modified Independent                  Transfers Overall transfer level: Needs assistance Equipment used: None Transfers: Sit to/from Stand Sit to Stand: Supervision                Ambulation/Gait Ambulation/Gait assistance: Supervision Gait Distance  (Feet): 100 Feet Assistive device: None Gait Pattern/deviations: Step-through pattern, Decreased stride length Gait velocity: decreased Gait velocity interpretation: 1.31 - 2.62 ft/sec, indicative of limited community ambulator   General Gait Details: Amb on RA with SpO2 95%. Max HR 132. No SOB/DOE  Stairs            Wheelchair Mobility     Tilt Bed    Modified Rankin (Stroke Patients Only)       Balance Overall balance assessment: No apparent balance deficits (not formally assessed)                                           Pertinent Vitals/Pain Pain Assessment Pain Assessment: No/denies pain    Home Living Family/patient expects to be discharged to:: Private residence Living Arrangements: Spouse/significant other Available Help at Discharge: Family;Available 24 hours/day Type of Home: House Home Access: Level entry       Home Layout: One level Home Equipment: Shower seat;Grab bars - tub/shower;Cane - single point      Prior Function Prior Level of Function : Independent/Modified Independent;Driving             Mobility Comments: active. Retired. No AD at baseline       Extremity/Trunk Assessment   Upper Extremity Assessment Upper Extremity Assessment: Overall WFL for tasks assessed    Lower Extremity Assessment Lower Extremity Assessment: Overall WFL for tasks assessed    Cervical / Trunk  Assessment Cervical / Trunk Assessment: Normal  Communication   Communication Communication: No apparent difficulties  Cognition Arousal: Alert Behavior During Therapy: WFL for tasks assessed/performed Overall Cognitive Status: Within Functional Limits for tasks assessed                                          General Comments General comments (skin integrity, edema, etc.): SpO2 in 90s on RA at rest and with activity. Max HR 132. Primarily sustained in 120s during amb.    Exercises     Assessment/Plan    PT  Assessment Patient needs continued PT services  PT Problem List Decreased mobility;Decreased activity tolerance;Cardiopulmonary status limiting activity       PT Treatment Interventions Functional mobility training;Patient/family education;Gait training;Therapeutic activities;Therapeutic exercise    PT Goals (Current goals can be found in the Care Plan section)  Acute Rehab PT Goals Patient Stated Goal: home PT Goal Formulation: With patient Time For Goal Achievement: 07/23/23 Potential to Achieve Goals: Good    Frequency Min 1X/week     Co-evaluation               AM-PAC PT 6 Clicks Mobility  Outcome Measure Help needed turning from your back to your side while in a flat bed without using bedrails?: None Help needed moving from lying on your back to sitting on the side of a flat bed without using bedrails?: None Help needed moving to and from a bed to a chair (including a wheelchair)?: A Little Help needed standing up from a chair using your arms (e.g., wheelchair or bedside chair)?: A Little Help needed to walk in hospital room?: A Little Help needed climbing 3-5 steps with a railing? : A Little 6 Click Score: 20    End of Session   Activity Tolerance: Patient tolerated treatment well Patient left: in bed;with call bell/phone within reach Nurse Communication: Mobility status PT Visit Diagnosis: Difficulty in walking, not elsewhere classified (R26.2)    Time: 1140-1200 PT Time Calculation (min) (ACUTE ONLY): 20 min   Charges:   PT Evaluation $PT Eval Low Complexity: 1 Low   PT General Charges $$ ACUTE PT VISIT: 1 Visit         Sari MATSU., PT  Office # [phone]   Erven Sari Shaker 07/09/2023, 12:58 PM

## 2023-07-09 NOTE — Progress Notes (Signed)
 PHARMACY - ANTICOAGULATION CONSULT NOTE  Pharmacy Consult for heparin  Indication: pulmonary embolus  No Known Allergies  Patient Measurements: Height: 5' (152.4 cm) Weight: 93.8 kg (206 lb 12.7 oz) IBW/kg (Calculated) : 45.5 Heparin  Dosing Weight: 70kg  Vital Signs: Temp: 98.1 F (36.7 C) (01/09 0348) Temp Source: Oral (01/09 0348) BP: 137/78 (01/09 0348) Pulse Rate: 91 (01/09 0348)  Labs: Recent Labs    07/07/23 0229 07/08/23 0233 07/09/23 0614  HGB 11.4* 11.6* 11.5*  HCT 34.6* 35.7* 34.7*  PLT 187 181 188  HEPARINUNFRC 0.35 0.53 0.22*  CREATININE 1.11*  --  1.06*    Estimated Creatinine Clearance (by C-G formula based on SCr of 1.06 mg/dL (H)) Female: 44.3 mL/min (A) Female: 68.1 mL/min (A)   Assessment: 80 YOF c/o worsening SOB despite antiviral tx for Covid, EMS reports O2 89-90% on RA and was at 93-94% on 4L, found at [facility] ED to have BLL PE w/ RHS >> tPA was given and pt tx'd to [facility] MICU for further treatment and now on IV heparin . Doppler noted with bilateral DVT  Heparin  level 0.22  Goal of Therapy:  Heparin  level 0.3-0.7 units/ml Monitor platelets by anticoagulation protocol: Yes   Plan:  Increase heparin  gtt to 1050 units/hr 8 hour heparin  level Daily heparin  level and CBC F/u transition to oral AC when able  Thank you Olam Monte, PharmD 07/09/2023, 8:33 AM

## 2023-07-10 ENCOUNTER — Other Ambulatory Visit (HOSPITAL_COMMUNITY): Payer: Self-pay

## 2023-07-10 DIAGNOSIS — I2699 Other pulmonary embolism without acute cor pulmonale: Secondary | ICD-10-CM | POA: Diagnosis not present

## 2023-07-10 LAB — BASIC METABOLIC PANEL
Anion gap: 12 (ref 5–15)
BUN: 20 mg/dL (ref 8–23)
CO2: 22 mmol/L (ref 22–32)
Calcium: 9.4 mg/dL (ref 8.9–10.3)
Chloride: 103 mmol/L (ref 98–111)
Creatinine, Ser: 1.1 mg/dL — ABNORMAL HIGH (ref 0.44–1.00)
GFR, Estimated: 56 mL/min — ABNORMAL LOW (ref >60)
Glucose, Bld: 106 mg/dL — ABNORMAL HIGH (ref 70–99)
Potassium: 4.3 mmol/L (ref 3.5–5.1)
Sodium: 137 mmol/L (ref 135–145)

## 2023-07-10 LAB — MAGNESIUM: Magnesium: 2.2 mg/dL (ref 1.7–2.4)

## 2023-07-10 MED ORDER — APIXABAN 5 MG PO TABS
10.0000 mg | ORAL_TABLET | Freq: Two times a day (BID) | ORAL | Status: DC
  Administered 2023-07-10: 10 mg via ORAL
  Filled 2023-07-10: qty 2

## 2023-07-10 MED ORDER — APIXABAN (ELIQUIS) VTE STARTER PACK (10MG AND 5MG)
ORAL_TABLET | ORAL | 0 refills | Status: AC
  Filled 2023-07-10: qty 74, 30d supply, fill #0

## 2023-07-10 MED ORDER — APIXABAN 5 MG PO TABS: 5.0000 mg | ORAL_TABLET | Freq: Two times a day (BID) | ORAL | Status: DC

## 2023-07-10 NOTE — Discharge Summary (Signed)
 Physician Discharge Summary   Patient: Dana Donovan MRN: [id number] DOB: 1960-11-23  Admit date:     07/04/2023  Discharge date: 07/10/23  Discharge Physician: Lonni SHAUNNA Dalton   PCP: Elliot Charm, MD     Recommendations at discharge:  Follow up with PCP Dr. Elliot for PE s/p tPA Dr. Elliot: Please obtain CBC and BMP in 1 week on new Eliquis  (discharge Hgb 11.5, Cr 1.1) Please obtain echocardiogram in 4-6 months to re-evaluate right ventricular systolic dysfunction Continue Eliquis  6 months for provoked DVT/PE  Follow up with Vascular Surgery Dr. 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 F with obesity, HTN, OSA, recent COVID who presented with chest discomfort and shortness of breath.  CTA showed large saddle PE.         * Acute massive pulmonary embolism (HCC) First-time provoked VTE due to COVID.  Syncopized at the time of admission, and so half dose tPA administered in the ER.  She had persistent symptoms, tachycardia, hypoxia, and since repeat CTA showed persistent large volume clots, IR were consulted and she underwent thrombectomy.  Afterwards she felt considerably better.  Treated with heparin  48 hours, transition today to Eliquis .  Plan for 6 months Eliquis .  DVT (deep venous thrombosis) (HCC) Given the extension up to the iliac veins, vascular surgery were consulted.  They felt that given she had no congestive symptoms in her legs, redness, pain or swelling, there was low benefit to LE thrombectomy, so this was  deferred.  They recommended outpatient follow up if symptoms occur or if there is uncertainty about symptoms.   AKI (acute kidney injury) (HCC) Baseline creatinine presumed normal; no prior to compare  Creatinine here peaked at 1.57 and resolved to 1.1 at discharge.           The [place]  Controlled Substances Registry was reviewed for this patient prior to discharge.   Consultants:  Interventional Radiology CCM Vasscular surgery  Procedures performed:  TPA Thrombectomy Echocardiogram CTA chest x2    Disposition: Home   DISCHARGE MEDICATION: Allergies as of 07/10/2023   No Known Allergies      Medication List     STOP taking these medications    nirmatrelvir /ritonavir  20 x 150 MG & 10 x 100MG  Tabs Commonly known as: PAXLOVID        TAKE these medications    albuterol  108 (90 Base) MCG/ACT inhaler Commonly known as: VENTOLIN  HFA Inhale 2 puffs into the lungs every 6 (six) hours as needed for wheezing or shortness of breath.   albuterol  (2.5 MG/3ML) 0.083% nebulizer solution Commonly known as: PROVENTIL  Take 2.5 mg by nebulization every 6 (six) hours as needed for shortness of breath or wheezing.   COVID-19 At Home Antigen Test Kit Use as directed.   diclofenac 75 MG EC tablet Commonly known as: VOLTAREN Take 75 mg by mouth 2 (two) times daily as needed for mild pain (pain score 1-3).   Dry Eye Relief Drops 0.2-0.2-1 % Soln Generic drug: Glycerin-Hypromellose-PEG 400 Place 1 drop into both eyes in the  morning, at noon, and at bedtime.   Eliquis  DVT/PE Starter Pack Generic drug: Apixaban  Starter Pack (10mg  and 5mg ) Take apixaban /Eliquis  10 mg (two tabs) twice daily for 7 days, then take apixaban /Eliquis  5 mg (one tab) twice daily   fluticasone  50 MCG/ACT nasal spray Commonly known as: FLONASE  Place 2 sprays into both nostrils daily. What changed:  when to take this reasons to take this   latanoprost 0.005 % ophthalmic  solution Commonly known as: XALATAN Place 1 drop into both eyes at bedtime.   lisinopril  40 MG tablet Commonly known as: ZESTRIL  Take 40 mg by mouth daily.   oxybutynin  10 MG 24 hr tablet Commonly known as: DITROPAN -XL Take 10 mg by mouth at bedtime.   PRESCRIPTION MEDICATION See admin instructions. CPAP- At bedtime   promethazine -dextromethorphan 6.25-15 MG/5ML syrup Commonly known as: PROMETHAZINE -DM Take 5 mLs by mouth 4 (four) times daily as needed. What changed: reasons to take this   triamterene -hydrochlorothiazide  75-50 MG tablet Commonly known as: MAXZIDE  Take 1 tablet by mouth daily.   TYLENOL  500 MG tablet Generic drug: acetaminophen  Take 500-1,000 mg by mouth every 6 (six) hours as needed for mild pain (pain score 1-3), headache or fever.   Wegovy  1 MG/0.5ML Soaj Generic drug: Semaglutide -Weight Management Inject 1 mg into the skin once a week. What changed: Another medication with the same name was changed. Make sure you understand how and when to take each.   Wegovy  1.7 MG/0.75ML Soaj Generic drug: Semaglutide -Weight Management Inject 1.7 mg into the skin once a week. What changed: Another medication with the same name was changed. Make sure you understand how and when to take each.   Wegovy  2.4 MG/0.75ML Soaj Generic drug: Semaglutide -Weight Management Inject 2.4 mg into the skin once a week. What changed: when to take this   Wegovy  2.4 MG/0.75ML Soaj Generic drug: Semaglutide -Weight Management Inject 2.4 mg into the skin every 7 (seven) days. What changed: Another medication with the same name was changed. Make sure you understand how and when to take each.        Follow-up Information     Elliot Charm, MD. Schedule an appointment as soon as possible for a visit in 1 week(s).   Specialty: Internal Medicine Contact information: [address] [place] [address] [phone]         Serene Gaile ORN, MD Follow up.    Specialties: Vascular Surgery, Cardiology Why: Call for an appointment Contact information: [address] [place] [address] [phone]                 Discharge Instructions     Discharge instructions   Complete by: As directed    **IMPORTANT DISCHARGE INSTRUCTIONS**   From hospitalist Dr. Jonel: You were admitted for a blood clot in the chest  Since the beginning of the pandemic, we have known that COVID is associated with clots in some people  Your clot was particularly large, and so you had an enzyme (TPA) that is a clot buster You also had a procedure called thrombectomy on the lung clots  You have clots in the legs, but these seem not to be causing symptoms  If you START to have symptoms, or if you are unsure if you have symptoms or not, call Dr. Russ office for follow up  If you develop symptoms, thrombectomy of the legs (similar to what you had in the chest) might help  If you do not have symptoms, thrombectomy wouldn't do much  that the blood thinner Eliquis  isn't already doing   Continue apixaban /Eliquis  for 6 months For the first week, take apixaban  10 mg (two tabs) twice daily including tonight Next Friday Jan 17, reduce to 5 mg (one tab) twice daily  If you notice black and sticky bowel movements or bloody bowel movements, call your doctor immediately, even on the weekend  If you have new severe and worsening chest pain, trouble breathing, or passing out, return to the ER  Resume your other home medicines  Go see Dr. Varadarajan in 1 week   Increase activity slowly   Complete by: As directed        Discharge Exam: Filed Weights   07/08/23 0500 07/09/23 0450 07/10/23 0525  Weight: 93.7 kg 93.8 kg 95.4 kg    General: Pt is alert, awake, not in acute distress Cardiovascular: RRR, nl S1-S2, no murmurs appreciated.   No LE edema.   Respiratory: Normal respiratory rate and rhythm.  CTAB without rales or wheezes. Abdominal: Abdomen  soft and non-tender.  No distension or HSM.   Neuro/Psych: Strength symmetric in upper and lower extremities.  Judgment and insight appear normal.   Condition at discharge: good  The results of significant diagnostics from this hospitalization (including imaging, microbiology, ancillary and laboratory) are listed below for reference.   Imaging Studies: IR Angiogram Pulmonary Bilateral Selective Result Date: 07/08/2023 INDICATION: 63 year old female with history of acute, intermediate-high risk pulmonary embolism and bilateral lower extremity deep vein thrombosis in the setting of COVID infection with persistent dysnpea, hypoxia, tachycardia, and right heart failure status post 1/2 dose t-pa 3 days ago. EXAM: 1. Ultrasound guided vascular access of the right common femoral vein. 2. Bilateral pulmonary arteriography and pulmonary manometry. 3. Bilateral pulmonary artery catheter directed aspiration thrombectomy. COMPARISON:  07/04/2023, 07/07/2023 MEDICATIONS: None. ANESTHESIA/SEDATION: Moderate (conscious) sedation was employed during this procedure. A total of Versed  1.5 mg and Fentanyl  50 mcg was administered intravenously. Moderate Sedation Time: 79 minutes. The patient's level of consciousness and vital signs were monitored continuously by radiology nursing throughout the procedure under my direct supervision. FLUOROSCOPY TIME:  One hundred seventy-one mGy COMPLICATIONS: None immediate. TECHNIQUE: The procedure was performed with the assistance of a my partner, Dr. Ami Bellman. Informed written consent was obtained from the patient after a thorough discussion of the procedural risks, benefits and alternatives. All questions were addressed. Maximal Sterile Barrier Technique was utilized including caps, mask, sterile gowns, sterile gloves, sterile drape, hand hygiene and skin antiseptic. A timeout was performed prior to the initiation of the procedure. Preprocedure ultrasound evaluation demonstrated  patency of the right common femoral vein. The procedure was planned. Subdermal Local anesthesia was provided the planned needle entry site with 1% lidocaine . Skin nick was made. Under direct ultrasound visualization, the right common femoral vein was accessed with a 21 gauge micropuncture needle. A permanent ultrasound image was captured and stored in the record. A micropuncture sheath was introduced through which a Wholey wire was placed and directed to the inferior vena cava under fluoroscopic guidance. Dilation with an 8 French sheath was performed followed by placement of 2 ProGlide devices at the 10 o'clock and 2 o'clock positions in pre close fashion. Next, a 24 French sheath was placed with the tip placed in the perihepatic inferior vena cava. An angled pigtail catheter was then inserted over the wire and under fluoroscopic guidance guided to the main pulmonary artery. Pulmonary angiogram was then performed which demonstrated limited perfusion in the right greater than  left pulmonary arteries. Pulmonary manometry was performed and measured at 71/30 mmHg with a mean pulmonary artery pressure of 46 mmHg. The [facility] wire was directed to the right pulmonary artery and the pigtail catheter was removed. The select catheter was unable to be advanced into the right pulmonary artery due to tortuosity. Therefore a 7 French, 70 cm Ansel sheath was inserted over the [facility] wire to the level of the main pulmonary artery. A Rosen wire was then directed into the right inferior pulmonary artery. A selective catheter was then inserted over the wire. The wire was exchanged for short taper superstiff Amplatz wire. The catheter and coaxial sheath were removed. Eight hundred twenty-four French FlowTriever aspiration catheter was inserted in the right inferior pulmonary artery and multiple aspirations were performed yielding acute appearing thrombus in the aspiration canister. The catheter was retracted into the right superior  pulmonary artery an additional aspirations were performed yielding small volume acute appearing thrombus. Right pulmonary angiogram was then performed which demonstrated significantly improved perfusion to the right upper lobe. There is small volume residual thrombus in the right inferior pulmonary artery. At this point, the patient's tachycardia resolved and hypoxia improved. The aspiration catheter was then retracted into the proximal left pulmonary artery. Aspiration thrombectomy was again performed this location yielding minimal acute appearing thrombus. Therefore, the curved 20 French aspiration catheter was inserted in coaxial fashion into the left inferior pulmonary artery. Aspiration was repeated which yielded moderate volume acute appearing thrombus in the aspiration canister. Pulmonary manometry was then repeated measuring 48 over 18 mmHg with a mean pulmonary artery pressure of 31 mmHg. Completion pulmonary angiography was performed which demonstrated significantly improved perfusion of the bilateral pulmonary arteries. There was mild persistent perfusion defect in the right inferior lobe. Given difficulty of cannulation of the right inferior pulmonary artery in addition to significant improvement in vital signs, the procedure was terminated. The catheters and wires were removed. The right groin sheath was removed and the Perclose devices were deployed. There was some persistent venous appearing bleeding from the right groin site therefore manual compression was applied and a 0 Prolene stent was applied at the skin in figure-of-eight fashion achieving hemostasis. Sterile bandage was applied. The patient tolerated the procedure well was transferred to the intensive care unit in good condition. IMPRESSION: Technically successful catheter directed bilateral pulmonary artery aspiration thrombectomy. Ester Sides, MD [facility] Radiology Electronically Signed    By: Ester Sides M.D.   On: 07/08/2023 06:34   IR THROMBECT PRIM MECH INIT (INCLU) MOD SED Result Date: 07/08/2023 INDICATION: 63 year old female with history of acute, intermediate-high risk pulmonary embolism and bilateral lower extremity deep vein thrombosis in the setting of COVID infection with persistent dysnpea, hypoxia, tachycardia, and right heart failure status post 1/2 dose t-pa 3 days ago. EXAM: 1. Ultrasound guided vascular access of the right common femoral vein. 2. Bilateral pulmonary arteriography and pulmonary manometry. 3. Bilateral pulmonary artery catheter directed aspiration thrombectomy. COMPARISON:  07/04/2023, 07/07/2023 MEDICATIONS: None. ANESTHESIA/SEDATION: Moderate (conscious) sedation was employed during this procedure. A total of Versed  1.5 mg and Fentanyl  50 mcg was administered intravenously. Moderate Sedation Time: 79 minutes. The patient's level of consciousness and vital signs were monitored continuously by radiology nursing throughout the procedure under my direct supervision. FLUOROSCOPY TIME:  One hundred seventy-one mGy COMPLICATIONS: None immediate. TECHNIQUE: The procedure was performed with the assistance of a my partner, Dr. Ami Bellman. Informed written consent was obtained from the patient after  a thorough discussion of the procedural risks, benefits and alternatives. All questions were addressed. Maximal Sterile Barrier Technique was utilized including caps, mask, sterile gowns, sterile gloves, sterile drape, hand hygiene and skin antiseptic. A timeout was performed prior to the initiation of the procedure. Preprocedure ultrasound evaluation demonstrated patency of the right common femoral vein. The procedure was planned. Subdermal Local anesthesia was provided the planned needle entry site with 1% lidocaine . Skin nick was made. Under direct ultrasound visualization, the right common femoral vein was accessed with a 21 gauge micropuncture needle. A permanent  ultrasound image was captured and stored in the record. A micropuncture sheath was introduced through which a Wholey wire was placed and directed to the inferior vena cava under fluoroscopic guidance. Dilation with an 8 French sheath was performed followed by placement of 2 ProGlide devices at the 10 o'clock and 2 o'clock positions in pre close fashion. Next, a 24 French sheath was placed with the tip placed in the perihepatic inferior vena cava. An angled pigtail catheter was then inserted over the wire and under fluoroscopic guidance guided to the main pulmonary artery. Pulmonary angiogram was then performed which demonstrated limited perfusion in the right greater than left pulmonary arteries. Pulmonary manometry was performed and measured at 71/30 mmHg with a mean pulmonary artery pressure of 46 mmHg. The [facility] wire was directed to the right pulmonary artery and the pigtail catheter was removed. The select catheter was unable to be advanced into the right pulmonary artery due to tortuosity. Therefore a 7 French, 70 cm Ansel sheath was inserted over the [facility] wire to the level of the main pulmonary artery. A Rosen wire was then directed into the right inferior pulmonary artery. A selective catheter was then inserted over the wire. The wire was exchanged for short taper superstiff Amplatz wire. The catheter and coaxial sheath were removed. Eight hundred twenty-four French FlowTriever aspiration catheter was inserted in the right inferior pulmonary artery and multiple aspirations were performed yielding acute appearing thrombus in the aspiration canister. The catheter was retracted into the right superior pulmonary artery an additional aspirations were performed yielding small volume acute appearing thrombus. Right pulmonary angiogram was then performed which demonstrated significantly improved perfusion to the right upper lobe. There is small volume residual thrombus in the right inferior pulmonary artery. At  this point, the patient's tachycardia resolved and hypoxia improved. The aspiration catheter was then retracted into the proximal left pulmonary artery. Aspiration thrombectomy was again performed this location yielding minimal acute appearing thrombus. Therefore, the curved 20 French aspiration catheter was inserted in coaxial fashion into the left inferior pulmonary artery. Aspiration was repeated which yielded moderate volume acute appearing thrombus in the aspiration canister. Pulmonary manometry was then repeated measuring 48 over 18 mmHg with a mean pulmonary artery pressure of 31 mmHg. Completion pulmonary angiography was performed which demonstrated significantly improved perfusion of the bilateral pulmonary arteries. There was mild persistent perfusion defect in the right inferior lobe. Given difficulty of cannulation of the right inferior pulmonary artery in addition to significant improvement in vital signs, the procedure was terminated. The catheters and wires were removed. The right groin sheath was removed and the Perclose devices were deployed. There was some persistent venous appearing bleeding from the right groin site therefore manual compression was applied and a 0 Prolene stent was applied at the skin in figure-of-eight fashion achieving hemostasis. Sterile bandage was applied. The patient tolerated the procedure well was transferred to the intensive care unit in good condition.  IMPRESSION: Technically successful catheter directed bilateral pulmonary artery aspiration thrombectomy. Ester Sides, MD [facility] Radiology Electronically Signed   By: Ester Sides M.D.   On: 07/08/2023 06:34   IR THROMBECT PRIM MECH INIT (INCLU) MOD SED Result Date: 07/08/2023 INDICATION: 63 year old female with history of acute, intermediate-high risk pulmonary embolism and bilateral lower extremity deep vein thrombosis in the setting of COVID infection with  persistent dysnpea, hypoxia, tachycardia, and right heart failure status post 1/2 dose t-pa 3 days ago. EXAM: 1. Ultrasound guided vascular access of the right common femoral vein. 2. Bilateral pulmonary arteriography and pulmonary manometry. 3. Bilateral pulmonary artery catheter directed aspiration thrombectomy. COMPARISON:  07/04/2023, 07/07/2023 MEDICATIONS: None. ANESTHESIA/SEDATION: Moderate (conscious) sedation was employed during this procedure. A total of Versed  1.5 mg and Fentanyl  50 mcg was administered intravenously. Moderate Sedation Time: 79 minutes. The patient's level of consciousness and vital signs were monitored continuously by radiology nursing throughout the procedure under my direct supervision. FLUOROSCOPY TIME:  One hundred seventy-one mGy COMPLICATIONS: None immediate. TECHNIQUE: The procedure was performed with the assistance of a my partner, Dr. Ami Bellman. Informed written consent was obtained from the patient after a thorough discussion of the procedural risks, benefits and alternatives. All questions were addressed. Maximal Sterile Barrier Technique was utilized including caps, mask, sterile gowns, sterile gloves, sterile drape, hand hygiene and skin antiseptic. A timeout was performed prior to the initiation of the procedure. Preprocedure ultrasound evaluation demonstrated patency of the right common femoral vein. The procedure was planned. Subdermal Local anesthesia was provided the planned needle entry site with 1% lidocaine . Skin nick was made. Under direct ultrasound visualization, the right common femoral vein was accessed with a 21 gauge micropuncture needle. A permanent ultrasound image was captured and stored in the record. A micropuncture sheath was introduced through which a Wholey wire was placed and directed to the inferior vena cava under fluoroscopic guidance. Dilation with an 8 French sheath was performed followed by placement of 2 ProGlide devices at the 10 o'clock  and 2 o'clock positions in pre close fashion. Next, a 24 French sheath was placed with the tip placed in the perihepatic inferior vena cava. An angled pigtail catheter was then inserted over the wire and under fluoroscopic guidance guided to the main pulmonary artery. Pulmonary angiogram was then performed which demonstrated limited perfusion in the right greater than left pulmonary arteries. Pulmonary manometry was performed and measured at 71/30 mmHg with a mean pulmonary artery pressure of 46 mmHg. The [facility] wire was directed to the right pulmonary artery and the pigtail catheter was removed. The select catheter was unable to be advanced into the right pulmonary artery due to tortuosity. Therefore a 7 French, 70 cm Ansel sheath was inserted over the [facility] wire to the level of the main pulmonary artery. A Rosen wire was then directed into the right inferior pulmonary artery. A selective catheter was then inserted over the wire. The wire was exchanged for short taper superstiff Amplatz wire. The catheter and coaxial sheath were removed. Eight hundred twenty-four French FlowTriever aspiration catheter was inserted in the right inferior pulmonary artery and multiple aspirations were performed yielding acute appearing thrombus in the aspiration canister. The catheter was retracted into the right superior pulmonary artery an additional aspirations were performed yielding small volume acute appearing thrombus. Right pulmonary angiogram was then performed which demonstrated significantly improved perfusion to the right upper lobe. There is small volume residual thrombus in the right inferior  pulmonary artery. At this point, the patient's tachycardia resolved and hypoxia improved. The aspiration catheter was then retracted into the proximal left pulmonary artery. Aspiration thrombectomy was again performed this location yielding minimal acute appearing thrombus. Therefore, the curved 20 French aspiration catheter was  inserted in coaxial fashion into the left inferior pulmonary artery. Aspiration was repeated which yielded moderate volume acute appearing thrombus in the aspiration canister. Pulmonary manometry was then repeated measuring 48 over 18 mmHg with a mean pulmonary artery pressure of 31 mmHg. Completion pulmonary angiography was performed which demonstrated significantly improved perfusion of the bilateral pulmonary arteries. There was mild persistent perfusion defect in the right inferior lobe. Given difficulty of cannulation of the right inferior pulmonary artery in addition to significant improvement in vital signs, the procedure was terminated. The catheters and wires were removed. The right groin sheath was removed and the Perclose devices were deployed. There was some persistent venous appearing bleeding from the right groin site therefore manual compression was applied and a 0 Prolene stent was applied at the skin in figure-of-eight fashion achieving hemostasis. Sterile bandage was applied. The patient tolerated the procedure well was transferred to the intensive care unit in good condition. IMPRESSION: Technically successful catheter directed bilateral pulmonary artery aspiration thrombectomy. Ester Sides, MD [facility] Radiology Electronically Signed   By: Ester Sides M.D.   On: 07/08/2023 06:34   IR US  Guide Vasc Access Right Result Date: 07/08/2023 INDICATION: 63 year old female with history of acute, intermediate-high risk pulmonary embolism and bilateral lower extremity deep vein thrombosis in the setting of COVID infection with persistent dysnpea, hypoxia, tachycardia, and right heart failure status post 1/2 dose t-pa 3 days ago. EXAM: 1. Ultrasound guided vascular access of the right common femoral vein. 2. Bilateral pulmonary arteriography and pulmonary manometry. 3. Bilateral pulmonary artery catheter directed aspiration thrombectomy. COMPARISON:   07/04/2023, 07/07/2023 MEDICATIONS: None. ANESTHESIA/SEDATION: Moderate (conscious) sedation was employed during this procedure. A total of Versed  1.5 mg and Fentanyl  50 mcg was administered intravenously. Moderate Sedation Time: 79 minutes. The patient's level of consciousness and vital signs were monitored continuously by radiology nursing throughout the procedure under my direct supervision. FLUOROSCOPY TIME:  One hundred seventy-one mGy COMPLICATIONS: None immediate. TECHNIQUE: The procedure was performed with the assistance of a my partner, Dr. Ami Bellman. Informed written consent was obtained from the patient after a thorough discussion of the procedural risks, benefits and alternatives. All questions were addressed. Maximal Sterile Barrier Technique was utilized including caps, mask, sterile gowns, sterile gloves, sterile drape, hand hygiene and skin antiseptic. A timeout was performed prior to the initiation of the procedure. Preprocedure ultrasound evaluation demonstrated patency of the right common femoral vein. The procedure was planned. Subdermal Local anesthesia was provided the planned needle entry site with 1% lidocaine . Skin nick was made. Under direct ultrasound visualization, the right common femoral vein was accessed with a 21 gauge micropuncture needle. A permanent ultrasound image was captured and stored in the record. A micropuncture sheath was introduced through which a Wholey wire was placed and directed to the inferior vena cava under fluoroscopic guidance. Dilation with an 8 French sheath was performed followed by placement of 2 ProGlide devices at the 10 o'clock and 2 o'clock positions in pre close fashion. Next, a 24 French sheath was placed with the tip placed in the perihepatic inferior vena cava. An angled pigtail catheter was then inserted over the wire and under fluoroscopic guidance guided to the main pulmonary  artery. Pulmonary angiogram was then performed which demonstrated  limited perfusion in the right greater than left pulmonary arteries. Pulmonary manometry was performed and measured at 71/30 mmHg with a mean pulmonary artery pressure of 46 mmHg. The [facility] wire was directed to the right pulmonary artery and the pigtail catheter was removed. The select catheter was unable to be advanced into the right pulmonary artery due to tortuosity. Therefore a 7 French, 70 cm Ansel sheath was inserted over the [facility] wire to the level of the main pulmonary artery. A Rosen wire was then directed into the right inferior pulmonary artery. A selective catheter was then inserted over the wire. The wire was exchanged for short taper superstiff Amplatz wire. The catheter and coaxial sheath were removed. Eight hundred twenty-four French FlowTriever aspiration catheter was inserted in the right inferior pulmonary artery and multiple aspirations were performed yielding acute appearing thrombus in the aspiration canister. The catheter was retracted into the right superior pulmonary artery an additional aspirations were performed yielding small volume acute appearing thrombus. Right pulmonary angiogram was then performed which demonstrated significantly improved perfusion to the right upper lobe. There is small volume residual thrombus in the right inferior pulmonary artery. At this point, the patient's tachycardia resolved and hypoxia improved. The aspiration catheter was then retracted into the proximal left pulmonary artery. Aspiration thrombectomy was again performed this location yielding minimal acute appearing thrombus. Therefore, the curved 20 French aspiration catheter was inserted in coaxial fashion into the left inferior pulmonary artery. Aspiration was repeated which yielded moderate volume acute appearing thrombus in the aspiration canister. Pulmonary manometry was then repeated measuring 48 over 18 mmHg with a mean pulmonary artery pressure of 31 mmHg. Completion pulmonary angiography was  performed which demonstrated significantly improved perfusion of the bilateral pulmonary arteries. There was mild persistent perfusion defect in the right inferior lobe. Given difficulty of cannulation of the right inferior pulmonary artery in addition to significant improvement in vital signs, the procedure was terminated. The catheters and wires were removed. The right groin sheath was removed and the Perclose devices were deployed. There was some persistent venous appearing bleeding from the right groin site therefore manual compression was applied and a 0 Prolene stent was applied at the skin in figure-of-eight fashion achieving hemostasis. Sterile bandage was applied. The patient tolerated the procedure well was transferred to the intensive care unit in good condition. IMPRESSION: Technically successful catheter directed bilateral pulmonary artery aspiration thrombectomy. Ester Sides, MD [facility] Radiology Electronically Signed   By: Ester Sides M.D.   On: 07/08/2023 06:34   IR Angiogram Selective Each Additional Vessel Result Date: 07/08/2023 INDICATION: 63 year old female with history of acute, intermediate-high risk pulmonary embolism and bilateral lower extremity deep vein thrombosis in the setting of COVID infection with persistent dysnpea, hypoxia, tachycardia, and right heart failure status post 1/2 dose t-pa 3 days ago. EXAM: 1. Ultrasound guided vascular access of the right common femoral vein. 2. Bilateral pulmonary arteriography and pulmonary manometry. 3. Bilateral pulmonary artery catheter directed aspiration thrombectomy. COMPARISON:  07/04/2023, 07/07/2023 MEDICATIONS: None. ANESTHESIA/SEDATION: Moderate (conscious) sedation was employed during this procedure. A total of Versed  1.5 mg and Fentanyl  50 mcg was administered intravenously. Moderate Sedation Time: 79 minutes. The patient's level of consciousness and vital signs were monitored  continuously by radiology nursing throughout the procedure under my direct supervision. FLUOROSCOPY TIME:  One hundred seventy-one mGy COMPLICATIONS: None immediate. TECHNIQUE: The procedure was performed with the assistance of a  my partner, Dr. Ami Bellman. Informed written consent was obtained from the patient after a thorough discussion of the procedural risks, benefits and alternatives. All questions were addressed. Maximal Sterile Barrier Technique was utilized including caps, mask, sterile gowns, sterile gloves, sterile drape, hand hygiene and skin antiseptic. A timeout was performed prior to the initiation of the procedure. Preprocedure ultrasound evaluation demonstrated patency of the right common femoral vein. The procedure was planned. Subdermal Local anesthesia was provided the planned needle entry site with 1% lidocaine . Skin nick was made. Under direct ultrasound visualization, the right common femoral vein was accessed with a 21 gauge micropuncture needle. A permanent ultrasound image was captured and stored in the record. A micropuncture sheath was introduced through which a Wholey wire was placed and directed to the inferior vena cava under fluoroscopic guidance. Dilation with an 8 French sheath was performed followed by placement of 2 ProGlide devices at the 10 o'clock and 2 o'clock positions in pre close fashion. Next, a 24 French sheath was placed with the tip placed in the perihepatic inferior vena cava. An angled pigtail catheter was then inserted over the wire and under fluoroscopic guidance guided to the main pulmonary artery. Pulmonary angiogram was then performed which demonstrated limited perfusion in the right greater than left pulmonary arteries. Pulmonary manometry was performed and measured at 71/30 mmHg with a mean pulmonary artery pressure of 46 mmHg. The [place] wire was directed to the right pulmonary artery and the pigtail catheter was removed. The select catheter was unable to be  advanced into the right pulmonary artery due to tortuosity. Therefore a 7 French, 70 cm Ansel sheath was inserted over the [facility] wire to the level of the main pulmonary artery. A Rosen wire was then directed into the right inferior pulmonary artery. A selective catheter was then inserted over the wire. The wire was exchanged for short taper superstiff Amplatz wire. The catheter and coaxial sheath were removed. Eight hundred twenty-four French FlowTriever aspiration catheter was inserted in the right inferior pulmonary artery and multiple aspirations were performed yielding acute appearing thrombus in the aspiration canister. The catheter was retracted into the right superior pulmonary artery an additional aspirations were performed yielding small volume acute appearing thrombus. Right pulmonary angiogram was then performed which demonstrated significantly improved perfusion to the right upper lobe. There is small volume residual thrombus in the right inferior pulmonary artery. At this point, the patient's tachycardia resolved and hypoxia improved. The aspiration catheter was then retracted into the proximal left pulmonary artery. Aspiration thrombectomy was again performed this location yielding minimal acute appearing thrombus. Therefore, the curved 20 French aspiration catheter was inserted in coaxial fashion into the left inferior pulmonary artery. Aspiration was repeated which yielded moderate volume acute appearing thrombus in the aspiration canister. Pulmonary manometry was then repeated measuring 48 over 18 mmHg with a mean pulmonary artery pressure of 31 mmHg. Completion pulmonary angiography was performed which demonstrated significantly improved perfusion of the bilateral pulmonary arteries. There was mild persistent perfusion defect in the right inferior lobe. Given difficulty of cannulation of the right inferior pulmonary artery in addition to significant improvement in vital signs, the procedure was  terminated. The catheters and wires were removed. The right groin sheath was removed and the Perclose devices were deployed. There was some persistent venous appearing bleeding from the right groin site therefore manual compression was applied and a 0 Prolene stent was applied at the skin in figure-of-eight fashion achieving hemostasis. Sterile bandage was applied. The patient  tolerated the procedure well was transferred to the intensive care unit in good condition. IMPRESSION: Technically successful catheter directed bilateral pulmonary artery aspiration thrombectomy. Ester Sides, MD [facility] Radiology Electronically Signed   By: Ester Sides M.D.   On: 07/08/2023 06:34   IR Angiogram Selective Each Additional Vessel Result Date: 07/08/2023 INDICATION: 63 year old female with history of acute, intermediate-high risk pulmonary embolism and bilateral lower extremity deep vein thrombosis in the setting of COVID infection with persistent dysnpea, hypoxia, tachycardia, and right heart failure status post 1/2 dose t-pa 3 days ago. EXAM: 1. Ultrasound guided vascular access of the right common femoral vein. 2. Bilateral pulmonary arteriography and pulmonary manometry. 3. Bilateral pulmonary artery catheter directed aspiration thrombectomy. COMPARISON:  07/04/2023, 07/07/2023 MEDICATIONS: None. ANESTHESIA/SEDATION: Moderate (conscious) sedation was employed during this procedure. A total of Versed  1.5 mg and Fentanyl  50 mcg was administered intravenously. Moderate Sedation Time: 79 minutes. The patient's level of consciousness and vital signs were monitored continuously by radiology nursing throughout the procedure under my direct supervision. FLUOROSCOPY TIME:  One hundred seventy-one mGy COMPLICATIONS: None immediate. TECHNIQUE: The procedure was performed with the assistance of a my partner, Dr. Ami Bellman. Informed written consent was obtained from the patient  after a thorough discussion of the procedural risks, benefits and alternatives. All questions were addressed. Maximal Sterile Barrier Technique was utilized including caps, mask, sterile gowns, sterile gloves, sterile drape, hand hygiene and skin antiseptic. A timeout was performed prior to the initiation of the procedure. Preprocedure ultrasound evaluation demonstrated patency of the right common femoral vein. The procedure was planned. Subdermal Local anesthesia was provided the planned needle entry site with 1% lidocaine . Skin nick was made. Under direct ultrasound visualization, the right common femoral vein was accessed with a 21 gauge micropuncture needle. A permanent ultrasound image was captured and stored in the record. A micropuncture sheath was introduced through which a Wholey wire was placed and directed to the inferior vena cava under fluoroscopic guidance. Dilation with an 8 French sheath was performed followed by placement of 2 ProGlide devices at the 10 o'clock and 2 o'clock positions in pre close fashion. Next, a 24 French sheath was placed with the tip placed in the perihepatic inferior vena cava. An angled pigtail catheter was then inserted over the wire and under fluoroscopic guidance guided to the main pulmonary artery. Pulmonary angiogram was then performed which demonstrated limited perfusion in the right greater than left pulmonary arteries. Pulmonary manometry was performed and measured at 71/30 mmHg with a mean pulmonary artery pressure of 46 mmHg. The [facility] wire was directed to the right pulmonary artery and the pigtail catheter was removed. The select catheter was unable to be advanced into the right pulmonary artery due to tortuosity. Therefore a 7 French, 70 cm Ansel sheath was inserted over the [facility] wire to the level of the main pulmonary artery. A Rosen wire was then directed into the right inferior pulmonary artery. A selective catheter was then inserted over the wire. The wire  was exchanged for short taper superstiff Amplatz wire. The catheter and coaxial sheath were removed. Eight hundred twenty-four French FlowTriever aspiration catheter was inserted in the right inferior pulmonary artery and multiple aspirations were performed yielding acute appearing thrombus in the aspiration canister. The catheter was retracted into the right superior pulmonary artery an additional aspirations were performed yielding small volume acute appearing thrombus. Right pulmonary angiogram was then performed which demonstrated significantly improved perfusion to the right  upper lobe. There is small volume residual thrombus in the right inferior pulmonary artery. At this point, the patient's tachycardia resolved and hypoxia improved. The aspiration catheter was then retracted into the proximal left pulmonary artery. Aspiration thrombectomy was again performed this location yielding minimal acute appearing thrombus. Therefore, the curved 20 French aspiration catheter was inserted in coaxial fashion into the left inferior pulmonary artery. Aspiration was repeated which yielded moderate volume acute appearing thrombus in the aspiration canister. Pulmonary manometry was then repeated measuring 48 over 18 mmHg with a mean pulmonary artery pressure of 31 mmHg. Completion pulmonary angiography was performed which demonstrated significantly improved perfusion of the bilateral pulmonary arteries. There was mild persistent perfusion defect in the right inferior lobe. Given difficulty of cannulation of the right inferior pulmonary artery in addition to significant improvement in vital signs, the procedure was terminated. The catheters and wires were removed. The right groin sheath was removed and the Perclose devices were deployed. There was some persistent venous appearing bleeding from the right groin site therefore manual compression was applied and a 0 Prolene stent was applied at the skin in figure-of-eight fashion  achieving hemostasis. Sterile bandage was applied. The patient tolerated the procedure well was transferred to the intensive care unit in good condition. IMPRESSION: Technically successful catheter directed bilateral pulmonary artery aspiration thrombectomy. Ester Sides, MD [facility] Radiology Electronically Signed   By: Ester Sides M.D.   On: 07/08/2023 06:34   CT Angio Abd/Pel w/ and/or w/o Result Date: 07/07/2023 CLINICAL DATA:  63 year old female with acute PE on CTA 3 days ago. Saddle embolus at that time. Extensive DVT. Query IVC thrombus. Re-evaluate PE. EXAM: CTA ABDOMEN AND PELVIS WITHOUT AND WITH CONTRAST TECHNIQUE: Multidetector CT imaging of the abdomen and pelvis was performed using the standard protocol during bolus administration of intravenous contrast. Multiplanar reconstructed images and MIPs were obtained and reviewed to evaluate the vascular anatomy. RADIATION DOSE REDUCTION: This exam was performed according to the departmental dose-optimization program which includes automated exposure control, adjustment of the mA and/or kV according to patient size and/or use of iterative reconstruction technique. CONTRAST:  75mL OMNIPAQUE  IOHEXOL  350 MG/ML SOLN COMPARISON:  CTA chest today reported separately. CTA chest on 07/04/2023. FINDINGS: VASCULAR Reflux of right atrial contrast into the hepatic veins and hepatic IVC appearing unchanged from the CTA on 07/04/2023. Otherwise no inferior vena cava contrast is present. Venous Vascular patency is not evaluated in the absence of IV contrast. Abdominal aorta timed vascular contrast. Normal caliber abdominal aorta and major arterial structures in the abdomen and pelvis appear patent and unremarkable. Review of the MIP images confirms the above findings. NON-VASCULAR Lower chest: Lower lobe PE stable to that reported separately today. Hepatobiliary: Cholecystectomy. Largely noncontrast appearance of the  liver except for hepatic venous contrast reflux. No liver abnormality identified. Pancreas: Negative. Spleen: Negative. Adrenals/Urinary Tract: Bilateral low-density adrenal gland thickening compatible with hyperplasia. Nonobstructed kidneys with symmetric renal enhancement. Small exophytic simple fluid density left renal midpole cyst suspected on series 5, image 70 (no follow-up imaging recommended). Decompressed ureters and bladder. Numerous pelvic phleboliths. Stomach/Bowel: Extensive large bowel diverticulosis from the hepatic flexure through the sigmoid colon. No active inflammation identified in those segments. Normal appendix on series 5, image 119. Decompressed terminal ileum and no dilated small bowel. Decompressed stomach and duodenum. No free air or free fluid. Lymphatic: No lymphadenopathy in the abdomen or pelvis. Reproductive: Within normal limits. Other: No pelvis free fluid. Musculoskeletal: Widespread severe lumbar  spine degeneration. Extensive vacuum disc and vacuum facet degenerative changes. Multifactorial spinal stenosis appears maximal in severe at L4-L5. No acute or suspicious osseous abnormality identified. IMPRESSION: 1. Aortic vascular contrast timing on this CTA abdomen and pelvis. Vena cava, Venous patency is not evaluated. A routine CT Abdomen and Pelvis with standard portal venous and delayed renal excretory phase IV contrast timing would best evaluate the IVC. Reflux of right heart contrast to the hepatic veins appears unchanged since 07/04/2023. 2. Abnormal CTA Chest today is reported separately. 3. No other acute or inflammatory process identified in the abdomen or pelvis. Extensive large bowel diverticulosis. Severe lumbar spine degeneration with multifactorial spinal stenosis. Electronically Signed   By: VEAR Hurst M.D.   On: 07/07/2023 12:36   CT Angio Chest Pulmonary Embolism (PE) W or WO Contrast Result Date: 07/07/2023 CLINICAL DATA:  63 year old female with acute PE on CTA 3  days ago. Saddle embolus at that time. Extensive DVT. Query IVC thrombus. Re-evaluate PE. EXAM: CT ANGIOGRAPHY CHEST WITH CONTRAST TECHNIQUE: Multidetector CT imaging of the chest was performed using the standard protocol during bolus administration of intravenous contrast. Multiplanar CT image reconstructions and MIPs were obtained to evaluate the vascular anatomy. RADIATION DOSE REDUCTION: This exam was performed according to the departmental dose-optimization program which includes automated exposure control, adjustment of the mA and/or kV according to patient size and/or use of iterative reconstruction technique. CONTRAST:  75mL OMNIPAQUE  IOHEXOL  350 MG/ML SOLN COMPARISON:  CTA chest 07/04/2023. FINDINGS: Cardiovascular: Good contrast bolus timing in the pulmonary arterial tree. Central pulmonary artery enlargement is stable since 07/04/2023. Saddle thrombus has resolved since that time but there is ongoing bilateral lobar pulmonary embolus, bulky in the right lung and left lower lobe. RV LV ratio remains abnormal (series 5, image 185. No pericardial effusion. Little contrast in the thoracic aorta. Mediastinum/Nodes: Stable and negative, no mediastinal mass or lymphadenopathy. Lungs/Pleura: Major airways remain patent. Lung volumes are mildly improved, less respiratory motion. No discrete pulmonary infarct. No consolidation or pleural effusion. Upper Abdomen: CTA abdomen and pelvis today reported separately. Musculoskeletal: Stable. No acute or suspicious osseous lesion in the chest. Lower cervical spine and midthoracic endplate degeneration and spurring. Review of the MIP images confirms the above findings. IMPRESSION: 1. Ongoing bilateral lobar Pulmonary Emboli. Saddle thrombus has resolved since 07/04/2023, central pulmonary artery enlargement and abnormal RV LV ratio are unchanged. 2. No pulmonary infarct.  No pleural or pericardial effusion. 3. CTA Abdomen and Pelvis today reported separately.  Electronically Signed   By: VEAR Hurst M.D.   On: 07/07/2023 12:27   ECHOCARDIOGRAM COMPLETE Result Date: 07/06/2023    ECHOCARDIOGRAM REPORT   Patient Name:   Dana Donovan Date of Exam: 07/06/2023 Medical Rec #:  [id number]            Height:       60.0 in Accession #:    [id number]           Weight:       203.3 lb Date of Birth:  07-08-1960           BSA:          1.879 m Patient Age:    62 years             BP:           153/86 mmHg Patient Gender: F                    HR:  102 bpm. Exam Location:  Inpatient Procedure: 2D Echo, Cardiac Doppler, Color Doppler and Intracardiac            Opacification Agent Indications:    Pulmonary Embolus I26.09  History:        Patient has no prior history of Echocardiogram examinations.                 Risk Factors:Hypertension.  Sonographer:    Thea Norlander RCS Referring Phys: [phone] PETER E BABCOCK IMPRESSIONS  1. Left ventricular ejection fraction, by estimation, is >75%. The left ventricle has hyperdynamic function. The left ventricle has no regional wall motion abnormalities. Left ventricular diastolic parameters are consistent with Grade I diastolic dysfunction (impaired relaxation).  2. Right ventricular systolic function is severely reduced. The right ventricular size is moderately enlarged. There is mildly elevated pulmonary artery systolic pressure. The estimated right ventricular systolic pressure is 43.5 mmHg.  3. Right atrial size was mildly dilated.  4. The mitral valve is normal in structure. No evidence of mitral valve regurgitation.  5. The aortic valve is tricuspid. Aortic valve regurgitation is not visualized.  6. The inferior vena cava is dilated in size with <50% respiratory variability, suggesting right atrial pressure of 15 mmHg. Comparison(s): No prior Echocardiogram. Conclusion(s)/Recommendation(s): Findings suggestive of significant RV strain in the setting of pulmonary embolus. FINDINGS  Left Ventricle: Left ventricular ejection  fraction, by estimation, is >75%. The left ventricle has hyperdynamic function. The left ventricle has no regional wall motion abnormalities. Definity  contrast agent was given IV to delineate the left ventricular endocardial borders. The left ventricular internal cavity size was normal in size. There is no left ventricular hypertrophy. Left ventricular diastolic parameters are consistent with Grade I diastolic dysfunction (impaired relaxation). Indeterminate filling pressures. Right Ventricle: The right ventricular size is moderately enlarged. No increase in right ventricular wall thickness. Right ventricular systolic function is severely reduced. There is mildly elevated pulmonary artery systolic pressure. The tricuspid regurgitant velocity is 2.67 m/s, and with an assumed right atrial pressure of 15 mmHg, the estimated right ventricular systolic pressure is 43.5 mmHg. Left Atrium: Left atrial size was normal in size. Right Atrium: Right atrial size was mildly dilated. Pericardium: There is no evidence of pericardial effusion. Mitral Valve: The mitral valve is normal in structure. No evidence of mitral valve regurgitation. Tricuspid Valve: The tricuspid valve is grossly normal. Tricuspid valve regurgitation is trivial. Aortic Valve: The aortic valve is tricuspid. Aortic valve regurgitation is not visualized. Aortic valve peak gradient measures 9.9 mmHg. Pulmonic Valve: The pulmonic valve was normal in structure. Pulmonic valve regurgitation is not visualized. Aorta: The aortic root and ascending aorta are structurally normal, with no evidence of dilitation. Venous: The inferior vena cava is dilated in size with less than 50% respiratory variability, suggesting right atrial pressure of 15 mmHg. IAS/Shunts: No atrial level shunt detected by color flow Doppler.  LEFT VENTRICLE PLAX 2D LVIDd:         3.80 cm   Diastology LVIDs:         1.50 cm   LV e' medial:    8.27 cm/s LV PW:         1.00 cm   LV E/e' medial:  10.7  LV IVS:        0.90 cm   LV e' lateral:   11.50 cm/s LVOT diam:     1.90 cm   LV E/e' lateral: 7.7 LV SV:         43  LV SV Index:   23 LVOT Area:     2.84 cm  RIGHT VENTRICLE             IVC RV S prime:     16.70 cm/s  IVC diam: 2.10 cm TAPSE (M-mode): 1.8 cm LEFT ATRIUM           Index        RIGHT ATRIUM           Index LA diam:      2.60 cm 1.38 cm/m   RA Area:     17.10 cm LA Vol (A2C): 28.5 ml 15.16 ml/m  RA Volume:   47.10 ml  25.06 ml/m LA Vol (A4C): 25.7 ml 13.67 ml/m  AORTIC VALVE AV Area (Vmax): 1.77 cm AV Vmax:        157.00 cm/s AV Peak Grad:   9.9 mmHg LVOT Vmax:      97.82 cm/s LVOT Vmean:     62.575 cm/s LVOT VTI:       0.153 m  AORTA Ao Root diam: 2.50 cm Ao Asc diam:  3.10 cm MITRAL VALVE                TRICUSPID VALVE MV Area (PHT): 5.31 cm     TR Peak grad:   28.5 mmHg MV Decel Time: 143 msec     TR Vmax:        267.00 cm/s MV E velocity: 88.10 cm/s MV A velocity: 128.00 cm/s  SHUNTS MV E/A ratio:  0.69         Systemic VTI:  0.15 m                             Systemic Diam: 1.90 cm Vinie Maxcy MD Electronically signed by Vinie Maxcy MD Signature Date/Time: 07/06/2023/4:39:02 PM    Final    VAS US  IVC/ILIAC (VENOUS ONLY) Result Date: 07/06/2023 IVC/ILIAC STUDY Patient Name:  Dana Donovan  Date of Exam:   07/06/2023 Medical Rec #: [id number]             Accession #:    [id number] Date of Birth: [birth year]            Patient Gender: F Patient Age:   63 years Exam Location:  [facility] Procedure:      VAS US  IVC/ILIAC (VENOUS ONLY) Referring Phys: --------------------------------------------------------------------------------  Indications: Extensive DVT and massive PE Other Factors: Covid 19 infection.  Comparison Study: No prior study on file Performing Technologist: Alberta Lis RVS  Examination Guidelines: A complete evaluation includes B-mode imaging, spectral Doppler, color Doppler, and power Doppler as needed of all accessible portions of each vessel. Bilateral  testing is considered an integral part of a complete examination. Limited examinations for reoccurring indications may be performed as noted.  IVC/Iliac Findings: +----------+------+--------+----------------+    IVC    PatentThrombus    Comments     +----------+------+--------+----------------+ IVC Prox         acute  partial thrombus +----------+------+--------+----------------+ IVC Mid          acute  partial thrombus +----------+------+--------+----------------+ IVC Distal       acute  partial thrombus +----------+------+--------+----------------+  +------------------+---------+-----------+---------+-----------+---------------+        CIV        RT-PatentRT-ThrombusLT-PatentLT-Thrombus   Comments     +------------------+---------+-----------+---------+-----------+---------------+ Common Iliac Prox  patent  acute       partial                                                                  thrombus     +------------------+---------+-----------+---------+-----------+---------------+ Common Iliac Mid   patent                         acute       partial                                                                  thrombus     +------------------+---------+-----------+---------+-----------+---------------+ Common Iliac       patent                         acute       partial     Distal                                                       thrombus     +------------------+---------+-----------+---------+-----------+---------------+  +-----------------+---------+-----------+---------+-----------+----------------+        EIV       RT-PatentRT-ThrombusLT-PatentLT-Thrombus    Comments     +-----------------+---------+-----------+---------+-----------+----------------+ External Iliac    patent                         acute   partial thrombus Vein Prox                                                                  +-----------------+---------+-----------+---------+-----------+----------------+ External Iliac    patent                         acute   partial thrombus Vein Mid                                                                  +-----------------+---------+-----------+---------+-----------+----------------+ External Iliac    patent                         acute   partial thrombus Vein Distal                                                               +-----------------+---------+-----------+---------+-----------+----------------+  Summary: IVC/Iliac: Partial acute thrombus noted throughout the left external and common iliac veins and the IVC.  *See table(s) above for measurements and observations.  Electronically signed by Lonni Gaskins MD on 07/06/2023 at 1:51:45 PM.    Final    VAS US  LOWER EXTREMITY VENOUS (DVT) Result Date: 07/06/2023  Lower Venous DVT Study Patient Name:  Dana Donovan  Date of Exam:   07/06/2023 Medical Rec #: [id number]             Accession #:    [id number] Date of Birth: 1961-01-02            Patient Gender: F Patient Age:   7 years Exam Location:  [facility] Procedure:      VAS US  LOWER EXTREMITY VENOUS (DVT) Referring Phys: DORN CHILL --------------------------------------------------------------------------------  Indications: Massive Pulmonary embolism, SOB, and Covid 19 infection.  Anticoagulation: Heparin . Comparison Study: No prior study on file Performing Technologist: Alberta Lis RVS  Examination Guidelines: A complete evaluation includes B-mode imaging, spectral Doppler, color Doppler, and power Doppler as needed of all accessible portions of each vessel. Bilateral testing is considered an integral part of a complete examination. Limited examinations for reoccurring indications may be performed as noted. The reflux portion of the exam is performed with the patient in reverse Trendelenburg.   +---------+---------------+---------+-----------+---------------+-------------+ RIGHT    CompressibilityPhasicitySpontaneityProperties     Thrombus                                                                 Aging         +---------+---------------+---------+-----------+---------------+-------------+ CFV      Full           Yes      No         pulsatile                                                                waveform                     +---------+---------------+---------+-----------+---------------+-------------+ SFJ      Full                                                            +---------+---------------+---------+-----------+---------------+-------------+ FV Prox  Partial                                           Acute         +---------+---------------+---------+-----------+---------------+-------------+ FV Mid   Partial        No       No                        Acute         +---------+---------------+---------+-----------+---------------+-------------+  FV DistalPartial        Yes      No         pulsatile      Acute                                                     waveform                     +---------+---------------+---------+-----------+---------------+-------------+ PFV      Partial                                           Acute         +---------+---------------+---------+-----------+---------------+-------------+ POP      Partial        No       No                        Acute         +---------+---------------+---------+-----------+---------------+-------------+ PTV      None                                              Acute         +---------+---------------+---------+-----------+---------------+-------------+ PERO     None                                              Acute         +---------+---------------+---------+-----------+---------------+-------------+ Gastroc  Partial         Yes      No         pulsatile      Acute                                                     waveform                     +---------+---------------+---------+-----------+---------------+-------------+   +---------+---------------+---------+-----------+---------------+-------------+ LEFT     CompressibilityPhasicitySpontaneityProperties     Thrombus                                                                 Aging         +---------+---------------+---------+-----------+---------------+-------------+ CFV      Partial        Yes      No         pulsatile      Acute  waveform                     +---------+---------------+---------+-----------+---------------+-------------+ SFJ      Partial                                           Acute         +---------+---------------+---------+-----------+---------------+-------------+ FV Prox  Partial        No       No                        Acute         +---------+---------------+---------+-----------+---------------+-------------+ FV Mid   None           No       No                        Acute         +---------+---------------+---------+-----------+---------------+-------------+ FV DistalNone           No       No                        Acute         +---------+---------------+---------+-----------+---------------+-------------+ PFV      Full           Yes      No         pulsatile      Acute                                                     waveform                     +---------+---------------+---------+-----------+---------------+-------------+ POP      None           No       No                        Acute         +---------+---------------+---------+-----------+---------------+-------------+ PTV      None                                              Acute          +---------+---------------+---------+-----------+---------------+-------------+ PERO     None                                              Acute         +---------+---------------+---------+-----------+---------------+-------------+ Gastroc  None           No       No                        Acute         +---------+---------------+---------+-----------+---------------+-------------+     Summary: RIGHT: - Findings consistent  with acute deep vein thrombosis involving the right femoral vein, right proximal profunda vein, right popliteal vein, right posterior tibial veins, and right peroneal veins. Findings consistent with acute intramuscular thrombosis involving the right gastrocnemius veins. - No cystic structure found in the popliteal fossa. pulsatile waveforms noted  LEFT: - Findings consistent with acute deep vein thrombosis involving the left common femoral vein, SF junction, left femoral vein, left popliteal vein, left posterior tibial veins, and left peroneal veins. Findings consistent with acute intramuscular thrombosis involving the left gastrocnemius veins. - No cystic structure found in the popliteal fossa. Pulsatile waveforms noted.  *See table(s) above for measurements and observations. Electronically signed by Lonni Gaskins MD on 07/06/2023 at 1:51:07 PM.    Final    CT Angio Chest PE W and/or Wo Contrast Result Date: 07/04/2023 CLINICAL DATA:  Increasing shortness of breath EXAM: CT ANGIOGRAPHY CHEST WITH CONTRAST TECHNIQUE: Multidetector CT imaging of the chest was performed using the standard protocol during bolus administration of intravenous contrast. Multiplanar CT image reconstructions and MIPs were obtained to evaluate the vascular anatomy. RADIATION DOSE REDUCTION: This exam was performed according to the departmental dose-optimization program which includes automated exposure control, adjustment of the mA and/or kV according to patient size and/or use of iterative  reconstruction technique. CONTRAST:  75mL OMNIPAQUE  IOHEXOL  350 MG/ML SOLN COMPARISON:  Chest x-ray from earlier in the same day. FINDINGS: Cardiovascular: Thoracic aorta is within normal limits. No aneurysmal dilatation or dissection is noted. Pulmonary artery is enlarged centrally with extensive bilateral pulmonary embolus with evidence of a saddle component centrally. Right ventricle is significantly enlarged consistent with right heart strain. RV/LV ratio calculates at 3. No coronary calcifications are noted. Contrast reflux into the hepatic veins is noted consistent with a degree of elevated right heart pressures. Mediastinum/Nodes: Thoracic inlet is within normal limits. No hilar or mediastinal adenopathy is noted. The esophagus as visualized is within normal limits. Lungs/Pleura: Lungs are well aerated bilaterally. No focal infiltrate or sizable effusion is seen. Upper Abdomen: Visualized upper abdomen shows some nodularity of the adrenal glands bilaterally. Musculoskeletal: No chest wall abnormality. No acute or significant osseous findings. Review of the MIP images confirms the above findings. IMPRESSION: Positive for acute bilateral PE with CT evidence of right heart strain (RV/LV Ratio = 3) consistent with at least submassive (intermediate risk) PE. The presence of right heart strain has been associated with an increased risk of morbidity and mortality. Please refer to the Code PE Focused order set in [facility] Critical Value/emergent results were called by telephone at the time of interpretation on 07/04/2023 at 9:26 pm to Dr. CAMERON ISAACS , who verbally acknowledged these results. Electronically Signed   By: Oneil Devonshire M.D.   On: 07/04/2023 21:31   DG Chest Port 1 View Result Date: 07/04/2023 CLINICAL DATA:  Shortness of breath with positive COVID test EXAM: PORTABLE CHEST 1 VIEW COMPARISON:  None Available. FINDINGS: Cardiac shadow is within normal limits. Lungs are well aerated bilaterally. Mild  right perihilar opacity is noted consistent with early infiltrate. No sizable effusion is seen. No bony abnormality is noted. IMPRESSION: Mild right perihilar infiltrate. Electronically Signed   By: Oneil Devonshire M.D.   On: 07/04/2023 19:55    Microbiology: Results for orders placed or performed during the hospital encounter of 07/04/23  MRSA Next Gen by PCR, Nasal     Status: None   Collection Time: 07/05/23 12:24 AM   Specimen: Nasal Mucosa; Nasal Swab  Result Value Ref Range Status  MRSA by PCR Next Gen NOT DETECTED NOT DETECTED Final    Comment: (NOTE) The GeneXpert MRSA Assay (FDA approved for NASAL specimens only), is one component of a comprehensive MRSA colonization surveillance program. It is not intended to diagnose MRSA infection nor to guide or monitor treatment for MRSA infections. Test performance is not FDA approved in patients less than 60 years old. Performed at [facility], [address], [place], [place] [address]     Labs: CBC: Recent Labs  Lab 07/05/23 0125 07/06/23 9787 07/07/23 0229 07/08/23 0233 07/09/23 0614  WBC 14.9* 13.4* 14.2* 13.2* 11.3*  HGB 13.1 10.8* 11.4* 11.6* 11.5*  HCT 40.2 33.2* 34.6* 35.7* 34.7*  MCV 84.3 83.8 83.0 85.0 83.8  PLT 114* 153 187 181 188   Basic Metabolic Panel: Recent Labs  Lab 07/04/23 1937 07/05/23 0416 07/07/23 0229 07/09/23 0614 07/10/23 0755  NA 131* 136 135 136 137  K 3.6 3.8 4.0 3.7 4.3  CL 96* 102 102 104 103  CO2 20* 19* 23 23 22   GLUCOSE 235* 114* 125* 79 106*  BUN 30* 28* 27* 24* 20  CREATININE 1.57* 1.33* 1.11* 1.06* 1.10*  CALCIUM 8.7* 8.4* 8.7* 8.7* 9.4  MG  --   --   --   --  2.2   Liver Function Tests: Recent Labs  Lab 07/04/23 2022  AST 29  ALT 24  ALKPHOS 78  BILITOT 0.6  PROT 7.6  ALBUMIN 3.6   CBG: Recent Labs  Lab 07/08/23 1604 07/09/23 0905 07/09/23 1216 07/09/23 1702 07/09/23 2116  GLUCAP 86 108* 121* 122* 105*    Discharge time spent: approximately 45  minutes spent on discharge counseling, evaluation of patient on day of discharge, and coordination of discharge planning with nursing, social work, pharmacy and case management  Signed: Lonni SHAUNNA Dalton, MD [facility] 07/10/2023

## 2023-07-10 NOTE — Discharge Instructions (Signed)
Information on my medicine - ELIQUIS (apixaban)  Why was Eliquis prescribed for you? Eliquis was prescribed to treat blood clots that may have been found in the veins of your legs (deep vein thrombosis) or in your lungs (pulmonary embolism) and to reduce the risk of them occurring again.  What do You need to know about Eliquis ? The starting dose is 10 mg (two 5 mg tablets) taken TWICE daily for the FIRST SEVEN (7) DAYS, then on the dose is reduced to ONE 5 mg tablet taken TWICE daily.  Eliquis may be taken with or without food.   Try to take the dose about the same time in the morning and in the evening. If you have difficulty swallowing the tablet whole please discuss with your pharmacist how to take the medication safely.  Take Eliquis exactly as prescribed and DO NOT stop taking Eliquis without talking to the doctor who prescribed the medication.  Stopping may increase your risk of developing a new blood clot.  Refill your prescription before you run out.  After discharge, you should have regular check-up appointments with your healthcare provider that is prescribing your Eliquis.    What do you do if you miss a dose? If a dose of ELIQUIS is not taken at the scheduled time, take it as soon as possible on the same day and twice-daily administration should be resumed. The dose should not be doubled to make up for a missed dose.  Important Safety Information A possible side effect of Eliquis is bleeding. You should call your healthcare provider right away if you experience any of the following: ? Bleeding from an injury or your nose that does not stop. ? Unusual colored urine (red or dark brown) or unusual colored stools (red or black). ? Unusual bruising for unknown reasons. ? A serious fall or if you hit your head (even if there is no bleeding).  Some medicines may interact with Eliquis and might increase your risk of bleeding or clotting while on Eliquis. To help avoid this,  consult your healthcare provider or pharmacist prior to using any new prescription or non-prescription medications, including herbals, vitamins, non-steroidal anti-inflammatory drugs (NSAIDs) and supplements.  This website has more information on Eliquis (apixaban): http://www.eliquis.com/eliquis/home  

## 2023-07-10 NOTE — Progress Notes (Signed)
 DISCHARGE NOTE  Discharge instructions given ; patient verbalizes understanding Removed peripheral IV's

## 2023-07-14 ENCOUNTER — Other Ambulatory Visit (HOSPITAL_COMMUNITY): Payer: Self-pay

## 2023-07-14 MED ORDER — WEGOVY 2.4 MG/0.75ML ~~LOC~~ SOAJ
2.4000 mg | SUBCUTANEOUS | 2 refills | Status: DC
  Filled 2023-07-14: qty 3, 28d supply, fill #0
  Filled 2023-09-14: qty 3, 28d supply, fill #1

## 2023-07-23 ENCOUNTER — Other Ambulatory Visit (HOSPITAL_COMMUNITY): Payer: Self-pay

## 2023-07-23 ENCOUNTER — Other Ambulatory Visit: Payer: Self-pay

## 2023-07-23 MED ORDER — WEGOVY 2.4 MG/0.75ML ~~LOC~~ SOAJ
2.4000 mg | SUBCUTANEOUS | 2 refills | Status: DC
  Filled 2023-07-23: qty 3, 28d supply, fill #0

## 2023-07-24 ENCOUNTER — Other Ambulatory Visit (HOSPITAL_COMMUNITY): Payer: Self-pay

## 2023-07-28 ENCOUNTER — Encounter: Payer: Self-pay | Admitting: Cardiovascular Disease

## 2023-07-28 ENCOUNTER — Ambulatory Visit: Payer: Managed Care, Other (non HMO) | Attending: Cardiovascular Disease | Admitting: Cardiovascular Disease

## 2023-07-28 VITALS — BP 152/78 | HR 88 | Ht 60.0 in | Wt 200.2 lb

## 2023-07-28 DIAGNOSIS — I10 Essential (primary) hypertension: Secondary | ICD-10-CM

## 2023-07-28 DIAGNOSIS — R0602 Shortness of breath: Secondary | ICD-10-CM | POA: Diagnosis not present

## 2023-07-28 DIAGNOSIS — I519 Heart disease, unspecified: Secondary | ICD-10-CM

## 2023-07-28 DIAGNOSIS — Z86711 Personal history of pulmonary embolism: Secondary | ICD-10-CM

## 2023-07-28 NOTE — Progress Notes (Unsigned)
Cardiology Office Note   Date:  07/29/2023   ID:  Dana Donovan, DOB Jul 14, 1960, MRN [id number]  PCP:  Dana Ishihara, MD  Cardiologist:   Dana Bears, MD   Chief Complaint  Patient presents with   New Patient (Initial Visit)    Referred for cardiac evaluation of History of pulmonary embolism.      History of Present Illness: Dana Donovan is a 63 y.o. adult who was referred for evaluation of RV dysfunction in the setting of saddle PE.  She has known history of essential hypertension, obesity and obstructive sleep apnea.   She was hospitalized in January with extensive bilateral DVT and saddle pulmonary embolism in the setting of COVID-19 infection.  She was treated with half dose tPA but had persistent tachycardia and hypoxia.  She underwent mechanical thrombectomy by IR.  She is now on anticoagulation with Eliquis. She had an echocardiogram done during the admission which showed hyperdynamic LV systolic function with moderately enlarged right ventricle and severely reduced RV function.  Pulmonary pressure was mildly elevated at 44 mmHg. She feels significantly better now and close to baseline.  She is very active and denies any chest pain.    Past Medical History:  Diagnosis Date   History of pulmonary embolism    Hypertension     Past Surgical History:  Procedure Laterality Date   IR ANGIOGRAM PULMONARY BILATERAL SELECTIVE  07/07/2023   IR ANGIOGRAM SELECTIVE EACH ADDITIONAL VESSEL  07/07/2023   IR ANGIOGRAM SELECTIVE EACH ADDITIONAL VESSEL  07/07/2023   IR THROMBECT PRIM MECH INIT (INCLU) MOD SED  07/07/2023   IR THROMBECT PRIM MECH INIT (INCLU) MOD SED  07/07/2023   IR US GUIDE VASC ACCESS RIGHT  07/07/2023     Current Outpatient Medications  Medication Sig Dispense Refill   albuterol (PROVENTIL) (2.5 MG/3ML) 0.083% nebulizer solution Take 2.5 mg by nebulization every 6 (six) hours as needed for shortness of breath or wheezing.     albuterol  (VENTOLIN HFA) 108 (90 Base) MCG/ACT inhaler Inhale 2 puffs into the lungs every 6 (six) hours as needed for wheezing or shortness of breath.     APIXABAN (ELIQUIS) VTE STARTER PACK (10MG  AND 5MG ) Take apixaban/Eliquis 10 mg (two tabs) twice daily for 7 days, then take apixaban/Eliquis 5 mg (one tab) twice daily 74 tablet 0   diclofenac (VOLTAREN) 75 MG EC tablet Take 75 mg by mouth 2 (two) times daily as needed for mild pain (pain score 1-3).     DRY EYE RELIEF DROPS 0.2-0.2-1 % SOLN Place 1 drop into both eyes in the morning, at noon, and at bedtime.     fluticasone (FLONASE) 50 MCG/ACT nasal spray Place 2 sprays into both nostrils daily. (Patient taking differently: Place 2 sprays into both nostrils daily as needed for allergies or rhinitis.) 16 g 0   latanoprost (XALATAN) 0.005 % ophthalmic solution Place 1 drop into both eyes at bedtime.     lisinopril (PRINIVIL,ZESTRIL) 40 MG tablet Take 40 mg by mouth daily.     oxybutynin (DITROPAN-XL) 10 MG 24 hr tablet Take 10 mg by mouth at bedtime.     PRESCRIPTION MEDICATION See admin instructions. CPAP- At bedtime     Semaglutide-Weight Management (WEGOVY) 2.4 MG/0.75ML SOAJ Inject 2.4 mg into the skin once a week. 2 mL 2   triamterene-hydrochlorothiazide (MAXZIDE) 75-50 MG tablet Take 1 tablet by mouth daily.     TYLENOL 500 MG tablet Take 500-1,000 mg by mouth every 6 (six) hours  as needed for mild pain (pain score 1-3), headache or fever.     No current facility-administered medications for this visit.    Allergies:   Patient has no known allergies.    Social History:  The patient  reports that she has never smoked. She has never used smokeless tobacco. She reports that she does not drink alcohol and does not use drugs.   Family History:  The patient's family history includes Heart disease in her maternal grandmother.    ROS:  Please see the history of present illness.   Otherwise, review of systems are positive for none.   All other systems  are reviewed and negative.    PHYSICAL EXAM: VS:  BP (!) 152/78 (BP Location: Left Arm, Patient Position: Sitting, Cuff Size: Large)   Pulse 88   Ht 5' (1.524 m)   Wt 200 lb 3.2 oz (90.8 kg)   SpO2 99%   BMI 39.10 kg/m  , BMI Body mass index is 39.1 kg/m. GEN: Well nourished, well developed, in no acute distress  HEENT: normal  Neck: no JVD, carotid bruits, or masses Cardiac: RRR; no murmurs, rubs, or gallops,no edema  Respiratory:  clear to auscultation bilaterally, normal work of breathing GI: soft, nontender, nondistended, + BS MS: no deformity or atrophy  Skin: warm and dry, no rash Neuro:  Strength and sensation are intact Psych: euthymic mood, full affect   EKG:  EKG is ordered today. The ekg ordered today demonstrates : Normal sinus rhythm with sinus arrhythmia Possible Left atrial enlargement When compared with ECG of 04-Jul-2023 19:35, Right bundle branch block is no longer Present    Recent Labs: 07/04/2023: ALT 24; B Natriuretic Peptide 2,240.2 07/09/2023: Hemoglobin 11.5; Platelets 188 07/10/2023: BUN 20; Creatinine, Ser 1.10; Magnesium 2.2; Potassium 4.3; Sodium 137    Lipid Panel No results found for: "CHOL", "TRIG", "HDL", "CHOLHDL", "VLDL", "LDLCALC", "LDLDIRECT"    Wt Readings from Last 3 Encounters:  07/28/23 200 lb 3.2 oz (90.8 kg)  07/10/23 210 lb 5.1 oz (95.4 kg)  07/04/23 199 lb (90.3 kg)          07/28/2023    2:58 PM  PAD Screen  Previous PAD dx? No  Previous surgical procedure? No  Pain with walking? No  Feet/toe relief with dangling? No  Painful, non-healing ulcers? No  Extremities discolored? No      ASSESSMENT AND PLAN:  1.  Recent right ventricular dysfunction in the setting of saddle pulmonary embolism: I suspect gradual improvement in RV function.  It is a good sign that her right bundle branch block resolved and she no longer has anterior T wave changes. Will obtain a follow-up echocardiogram in 2 months from now.  The  patient has no symptoms of angina or heart failure at the present time.  2.  Saddle pulmonary embolism and extensive DVT: Currently on Eliquis with plans to treat for a minimum of 6 months given that the episode was provoked by COVID infection.  Considering the burden of thrombus, consider low-dose Eliquis long-term if tolerated.  3.  Essential hypertension: Blood pressure is mildly elevated today.  She takes lisinopril 40 mg once daily.    Disposition:   FU as needed if echocardiogram is abnormal in 2 months.  Signed,  Dana Bears, MD  07/29/2023 4:55 PM    [facility]

## 2023-07-28 NOTE — Patient Instructions (Signed)
Medication Instructions:  Your physician recommends that you continue on your current medications as directed. Please refer to the Current Medication list given to you today.  *If you need a refill on your cardiac medications before your next appointment, please call your pharmacy*  Testing/Procedures: Your physician has requested that you have an echocardiogram in 2 months . Echocardiography is a painless test that uses sound waves to create images of your heart. It provides your doctor with information about the size and shape of your heart and how well your heart's chambers and valves are working. This procedure takes approximately one hour. There are no restrictions for this procedure. Please do NOT wear cologne, perfume, aftershave, or lotions (deodorant is allowed). Please arrive 15 minutes prior to your appointment time.  Please note: We ask at that you not bring children with you during ultrasound (echo/ vascular) testing. Due to room size and safety concerns, children are not allowed in the ultrasound rooms during exams. Our front office staff cannot provide observation of children in our lobby area while testing is being conducted. An adult accompanying a patient to their appointment will only be allowed in the ultrasound room at the discretion of the ultrasound technician under special circumstances. We apologize for any inconvenience.    Follow-Up: At [facility], you and your health needs are our priority.  As part of our continuing mission to provide you with exceptional heart care, we have created designated Provider Care Teams.  These Care Teams include your primary Cardiologist (physician) and Advanced Practice Providers (APPs -  Physician Assistants and Nurse Practitioners) who all work together to provide you with the care you need, when you need it.  Your next appointment:    As needed  Provider:   Lorine Bears, MD    Other Instructions:

## 2023-07-31 ENCOUNTER — Ambulatory Visit (INDEPENDENT_AMBULATORY_CARE_PROVIDER_SITE_OTHER): Payer: Managed Care, Other (non HMO) | Admitting: Vascular Surgery

## 2023-07-31 ENCOUNTER — Encounter (INDEPENDENT_AMBULATORY_CARE_PROVIDER_SITE_OTHER): Payer: Self-pay | Admitting: Vascular Surgery

## 2023-07-31 VITALS — BP 122/78 | HR 76 | Resp 16 | Wt 199.0 lb

## 2023-07-31 DIAGNOSIS — I82422 Acute embolism and thrombosis of left iliac vein: Secondary | ICD-10-CM

## 2023-07-31 DIAGNOSIS — I2699 Other pulmonary embolism without acute cor pulmonale: Secondary | ICD-10-CM

## 2023-07-31 DIAGNOSIS — I10 Essential (primary) hypertension: Secondary | ICD-10-CM | POA: Insufficient documentation

## 2023-07-31 NOTE — Assessment & Plan Note (Signed)
 blood pressure control important in reducing the progression of atherosclerotic disease. On appropriate oral medications.

## 2023-07-31 NOTE — Assessment & Plan Note (Signed)
The patient has fairly extensive left lower extremity DVT but minimal left leg symptoms.  Her DVT is now 9 to 21 weeks old and particular with minimal symptoms, I would not recommend any intervention.  I would recommend 1 year of full dose anticoagulation and then likely reduced to a half dose of Eliquis going forward for prophylaxis.  I will plan to see her back in about 6 months and we will perform a left venous duplex to evaluate her iliofemoral system to evaluate for chronic stenosis.  No intervention planned at this time.  I have recommended she get compression socks and wear these daily.  I recommended exercise and elevation.

## 2023-07-31 NOTE — Assessment & Plan Note (Signed)
Status post mechanical thrombectomy and another institution a few weeks ago.  Would continue full dose anticoagulation for 1 year and then likely reduced to a prophylactic dose of Eliquis going forward.

## 2023-07-31 NOTE — Progress Notes (Signed)
Patient ID: Marvena Tally, adult   DOB: 1960/08/28, 63 y.o.   MRN: [id number]  Chief Complaint  Patient presents with   New Patient (Initial Visit)    [facility] follow up     HPI Marielis Samara is a 63 y.o. adult.  I am asked to see the patient by Dr. Myra Gianotti for evaluation of left lower extremity DVT and pulmonary embolus.  About 3 weeks ago, she was admitted to main campus [facility] where she was found to have extensive left lower extremity DVT and a saddle pulmonary embolus.  She actually had what sounds like a combination of systemic thrombolytic therapy followed by catheter directed mechanical thrombectomy performed by radiology at that institution.  Despite having what sounds like a fairly extensive left lower extremity DVT, her symptoms were fairly mild and no intervention was required at that time.  She has some mild heaviness and tiredness in her left leg in the evening with minimal swelling present.  She is tolerating anticoagulation without any major issues.  She does have upcoming trips planned for March that are fairly long drives, and had some questions about this.  She has been slowly resuming all her normal activities over the past few weeks.     Past Medical History:  Diagnosis Date   History of pulmonary embolism    Hypertension     Past Surgical History:  Procedure Laterality Date   IR ANGIOGRAM PULMONARY BILATERAL SELECTIVE  07/07/2023   IR ANGIOGRAM SELECTIVE EACH ADDITIONAL VESSEL  07/07/2023   IR ANGIOGRAM SELECTIVE EACH ADDITIONAL VESSEL  07/07/2023   IR THROMBECT PRIM MECH INIT (INCLU) MOD SED  07/07/2023   IR THROMBECT PRIM MECH INIT (INCLU) MOD SED  07/07/2023   IR US GUIDE VASC ACCESS RIGHT  07/07/2023     Family History  Problem Relation Age of Onset   Heart disease Maternal Grandmother    Breast cancer Neg Hx   No bleeding or clotting disorders No aneurysms   Social History   Tobacco Use   Smoking status: Never   Smokeless tobacco:  Never  Vaping Use   Vaping status: Never Used  Substance Use Topics   Alcohol use: Never   Drug use: Never     No Known Allergies  Current Outpatient Medications  Medication Sig Dispense Refill   albuterol (PROVENTIL) (2.5 MG/3ML) 0.083% nebulizer solution Take 2.5 mg by nebulization every 6 (six) hours as needed for shortness of breath or wheezing.     albuterol (VENTOLIN HFA) 108 (90 Base) MCG/ACT inhaler Inhale 2 puffs into the lungs every 6 (six) hours as needed for wheezing or shortness of breath.     APIXABAN (ELIQUIS) VTE STARTER PACK (10MG  AND 5MG ) Take apixaban/Eliquis 10 mg (two tabs) twice daily for 7 days, then take apixaban/Eliquis 5 mg (one tab) twice daily 74 tablet 0   diclofenac (VOLTAREN) 75 MG EC tablet Take 75 mg by mouth 2 (two) times daily as needed for mild pain (pain score 1-3).     DRY EYE RELIEF DROPS 0.2-0.2-1 % SOLN Place 1 drop into both eyes in the morning, at noon, and at bedtime.     fluticasone (FLONASE) 50 MCG/ACT nasal spray Place 2 sprays into both nostrils daily. (Patient taking differently: Place 2 sprays into both nostrils daily as needed for allergies or rhinitis.) 16 g 0   latanoprost (XALATAN) 0.005 % ophthalmic solution Place 1 drop into both eyes at bedtime.     lisinopril (PRINIVIL,ZESTRIL) 40 MG  tablet Take 40 mg by mouth daily.     oxybutynin (DITROPAN-XL) 10 MG 24 hr tablet Take 10 mg by mouth at bedtime.     PRESCRIPTION MEDICATION See admin instructions. CPAP- At bedtime     Semaglutide-Weight Management (WEGOVY) 2.4 MG/0.75ML SOAJ Inject 2.4 mg into the skin once a week. 2 mL 2   triamterene-hydrochlorothiazide (MAXZIDE) 75-50 MG tablet Take 1 tablet by mouth daily.     TYLENOL 500 MG tablet Take 500-1,000 mg by mouth every 6 (six) hours as needed for mild pain (pain score 1-3), headache or fever.     No current facility-administered medications for this visit.      REVIEW OF SYSTEMS (Negative unless checked)  Constitutional:  [] Weight loss  [] Fever  [] Chills Cardiac: [] Chest pain   [] Chest pressure   [] Palpitations   [] Shortness of breath when laying flat   [] Shortness of breath at rest   [] Shortness of breath with exertion. Vascular:  [x] Pain in legs with walking   [] Pain in legs at rest   [] Pain in legs when laying flat   [] Claudication   [] Pain in feet when walking  [] Pain in feet at rest  [] Pain in feet when laying flat   [x] History of DVT   [x] Phlebitis   [x] Swelling in legs   [] Varicose veins   [] Non-healing ulcers Pulmonary:   [] Uses home oxygen   [] Productive cough   [] Hemoptysis   [] Wheeze  [] COPD   [] Asthma Neurologic:  [] Dizziness  [] Blackouts   [] Seizures   [] History of stroke   [] History of TIA  [] Aphasia   [] Temporary blindness   [] Dysphagia   [] Weakness or numbness in arms   [] Weakness or numbness in legs Musculoskeletal:  [] Arthritis   [] Joint swelling   [] Joint pain   [] Low back pain Hematologic:  [] Easy bruising  [] Easy bleeding   [] Hypercoagulable state   [] Anemic  [] Hepatitis Gastrointestinal:  [] Blood in stool   [] Vomiting blood  [] Gastroesophageal reflux/heartburn   [] Abdominal pain Genitourinary:  [] Chronic kidney disease   [] Difficult urination  [] Frequent urination  [] Burning with urination   [] Hematuria Skin:  [] Rashes   [] Ulcers   [] Wounds Psychological:  [] History of anxiety   []  History of major depression.    Physical Exam BP 122/78   Pulse 76   Resp 16   Wt 199 lb (90.3 kg)   BMI 38.86 kg/m  Gen:  WD/WN, NAD Head: [place]/AT, No temporalis wasting. Ear/Nose/Throat: Hearing grossly intact, nares w/o erythema or drainage, oropharynx w/o Erythema/Exudate Eyes: Conjunctiva clear, sclera non-icteric  Neck: trachea midline.  No JVD.  Pulmonary:  Good air movement, respirations not labored, no use of accessory muscles  Cardiac: RRR, no JVD Vascular:  Vessel Right Left  Radial Palpable Palpable                                   Gastrointestinal:. No masses, surgical incisions, or  scars. Musculoskeletal: M/S 5/5 throughout.  Extremities without ischemic changes.  No deformity or atrophy. Trace LLE edema. Neurologic: Sensation grossly intact in extremities.  Symmetrical.  Speech is fluent. Motor exam as listed above. Psychiatric: Judgment intact, Mood & affect appropriate for pt's clinical situation. Dermatologic: No rashes or ulcers noted.  No cellulitis or open wounds.    Radiology IR Angiogram Pulmonary Bilateral Selective Result Date: 07/08/2023 INDICATION: 63 year old female with history of acute, intermediate-high risk pulmonary embolism and bilateral lower extremity deep vein thrombosis in  the setting of COVID infection with persistent dysnpea, hypoxia, tachycardia, and right heart failure status post 1/2 dose t-pa 3 days ago. EXAM: 1. Ultrasound guided vascular access of the right common femoral vein. 2. Bilateral pulmonary arteriography and pulmonary manometry. 3. Bilateral pulmonary artery catheter directed aspiration thrombectomy. COMPARISON:  07/04/2023, 07/07/2023 MEDICATIONS: None. ANESTHESIA/SEDATION: Moderate (conscious) sedation was employed during this procedure. A total of Versed 1.5 mg and Fentanyl 50 mcg was administered intravenously. Moderate Sedation Time: 79 minutes. The patient's level of consciousness and vital signs were monitored continuously by radiology nursing throughout the procedure under my direct supervision. FLUOROSCOPY TIME:  One hundred seventy-one mGy COMPLICATIONS: None immediate. TECHNIQUE: The procedure was performed with the assistance of a my partner, Dr. Gilmer Mor. Informed written consent was obtained from the patient after a thorough discussion of the procedural risks, benefits and alternatives. All questions were addressed. Maximal Sterile Barrier Technique was utilized including caps, mask, sterile gowns, sterile gloves, sterile drape, hand hygiene and skin antiseptic. A timeout was performed prior to the initiation of the  procedure. Preprocedure ultrasound evaluation demonstrated patency of the right common femoral vein. The procedure was planned. Subdermal Local anesthesia was provided the planned needle entry site with 1% lidocaine. Skin nick was made. Under direct ultrasound visualization, the right common femoral vein was accessed with a 21 gauge micropuncture needle. A permanent ultrasound image was captured and stored in the record. A micropuncture sheath was introduced through which a Wholey wire was placed and directed to the inferior vena cava under fluoroscopic guidance. Dilation with an 8 French sheath was performed followed by placement of 2 ProGlide devices at the 10 o'clock and 2 o'clock positions in pre close fashion. Next, a 24 French sheath was placed with the tip placed in the perihepatic inferior vena cava. An angled pigtail catheter was then inserted over the wire and under fluoroscopic guidance guided to the main pulmonary artery. Pulmonary angiogram was then performed which demonstrated limited perfusion in the right greater than left pulmonary arteries. Pulmonary manometry was performed and measured at 71/30 mmHg with a mean pulmonary artery pressure of 46 mmHg. The [facility] wire was directed to the right pulmonary artery and the pigtail catheter was removed. The select catheter was unable to be advanced into the right pulmonary artery due to tortuosity. Therefore a 7 [place], 70 cm Ansel sheath was inserted over the [facility] wire to the level of the main pulmonary artery. A Rosen wire was then directed into the right inferior pulmonary artery. A selective catheter was then inserted over the wire. The wire was exchanged for short taper superstiff Amplatz wire. The catheter and coaxial sheath were removed. Eight hundred twenty-four [place] FlowTriever aspiration catheter was inserted in the right inferior pulmonary artery and multiple aspirations were performed yielding acute appearing thrombus in the aspiration  canister. The catheter was retracted into the right superior pulmonary artery an additional aspirations were performed yielding small volume acute appearing thrombus. Right pulmonary angiogram was then performed which demonstrated significantly improved perfusion to the right upper lobe. There is small volume residual thrombus in the right inferior pulmonary artery. At this point, the patient's tachycardia resolved and hypoxia improved. The aspiration catheter was then retracted into the proximal left pulmonary artery. Aspiration thrombectomy was again performed this location yielding minimal acute appearing thrombus. Therefore, the curved 20 French aspiration catheter was inserted in coaxial fashion into the left inferior pulmonary artery. Aspiration was repeated which yielded moderate volume acute appearing thrombus in the aspiration canister.  Pulmonary manometry was then repeated measuring 48 over 18 mmHg with a mean pulmonary artery pressure of 31 mmHg. Completion pulmonary angiography was performed which demonstrated significantly improved perfusion of the bilateral pulmonary arteries. There was mild persistent perfusion defect in the right inferior lobe. Given difficulty of cannulation of the right inferior pulmonary artery in addition to significant improvement in vital signs, the procedure was terminated. The catheters and wires were removed. The right groin sheath was removed and the Perclose devices were deployed. There was some persistent venous appearing bleeding from the right groin site therefore manual compression was applied and a 0 Prolene stent was applied at the skin in figure-of-eight fashion achieving hemostasis. Sterile bandage was applied. The patient tolerated the procedure well was transferred to the intensive care unit in good condition. IMPRESSION: Technically successful catheter directed bilateral pulmonary artery aspiration thrombectomy. Marliss Coots, MD [facility] Radiology Electronically Signed   By: Marliss Coots M.D.   On: 07/08/2023 06:34   IR THROMBECT PRIM MECH INIT (INCLU) MOD SED Result Date: 07/08/2023 INDICATION: 63 year old female with history of acute, intermediate-high risk pulmonary embolism and bilateral lower extremity deep vein thrombosis in the setting of COVID infection with persistent dysnpea, hypoxia, tachycardia, and right heart failure status post 1/2 dose t-pa 3 days ago. EXAM: 1. Ultrasound guided vascular access of the right common femoral vein. 2. Bilateral pulmonary arteriography and pulmonary manometry. 3. Bilateral pulmonary artery catheter directed aspiration thrombectomy. COMPARISON:  07/04/2023, 07/07/2023 MEDICATIONS: None. ANESTHESIA/SEDATION: Moderate (conscious) sedation was employed during this procedure. A total of Versed 1.5 mg and Fentanyl 50 mcg was administered intravenously. Moderate Sedation Time: 79 minutes. The patient's level of consciousness and vital signs were monitored continuously by radiology nursing throughout the procedure under my direct supervision. FLUOROSCOPY TIME:  One hundred seventy-one mGy COMPLICATIONS: None immediate. TECHNIQUE: The procedure was performed with the assistance of a my partner, Dr. Gilmer Mor. Informed written consent was obtained from the patient after a thorough discussion of the procedural risks, benefits and alternatives. All questions were addressed. Maximal Sterile Barrier Technique was utilized including caps, mask, sterile gowns, sterile gloves, sterile drape, hand hygiene and skin antiseptic. A timeout was performed prior to the initiation of the procedure. Preprocedure ultrasound evaluation demonstrated patency of the right common femoral vein. The procedure was planned. Subdermal Local anesthesia was provided the planned needle entry site with 1% lidocaine. Skin nick was made. Under direct ultrasound visualization, the right common femoral vein was  accessed with a 21 gauge micropuncture needle. A permanent ultrasound image was captured and stored in the record. A micropuncture sheath was introduced through which a Wholey wire was placed and directed to the inferior vena cava under fluoroscopic guidance. Dilation with an 8 French sheath was performed followed by placement of 2 ProGlide devices at the 10 o'clock and 2 o'clock positions in pre close fashion. Next, a 24 French sheath was placed with the tip placed in the perihepatic inferior vena cava. An angled pigtail catheter was then inserted over the wire and under fluoroscopic guidance guided to the main pulmonary artery. Pulmonary angiogram was then performed which demonstrated limited perfusion in the right greater than left pulmonary arteries. Pulmonary manometry was performed and measured at 71/30 mmHg with a mean pulmonary artery pressure of 46 mmHg. The [facility] wire was directed to the right pulmonary artery and the pigtail catheter was removed. The select catheter was unable to be advanced into the right pulmonary artery due  to tortuosity. Therefore a 7 [place], 70 cm Ansel sheath was inserted over the [facility] wire to the level of the main pulmonary artery. A Rosen wire was then directed into the right inferior pulmonary artery. A selective catheter was then inserted over the wire. The wire was exchanged for short taper superstiff Amplatz wire. The catheter and coaxial sheath were removed. Eight hundred twenty-four [place] FlowTriever aspiration catheter was inserted in the right inferior pulmonary artery and multiple aspirations were performed yielding acute appearing thrombus in the aspiration canister. The catheter was retracted into the right superior pulmonary artery an additional aspirations were performed yielding small volume acute appearing thrombus. Right pulmonary angiogram was then performed which demonstrated significantly improved perfusion to the right upper lobe. There is small volume  residual thrombus in the right inferior pulmonary artery. At this point, the patient's tachycardia resolved and hypoxia improved. The aspiration catheter was then retracted into the proximal left pulmonary artery. Aspiration thrombectomy was again performed this location yielding minimal acute appearing thrombus. Therefore, the curved 20 French aspiration catheter was inserted in coaxial fashion into the left inferior pulmonary artery. Aspiration was repeated which yielded moderate volume acute appearing thrombus in the aspiration canister. Pulmonary manometry was then repeated measuring 48 over 18 mmHg with a mean pulmonary artery pressure of 31 mmHg. Completion pulmonary angiography was performed which demonstrated significantly improved perfusion of the bilateral pulmonary arteries. There was mild persistent perfusion defect in the right inferior lobe. Given difficulty of cannulation of the right inferior pulmonary artery in addition to significant improvement in vital signs, the procedure was terminated. The catheters and wires were removed. The right groin sheath was removed and the Perclose devices were deployed. There was some persistent venous appearing bleeding from the right groin site therefore manual compression was applied and a 0 Prolene stent was applied at the skin in figure-of-eight fashion achieving hemostasis. Sterile bandage was applied. The patient tolerated the procedure well was transferred to the intensive care unit in good condition. IMPRESSION: Technically successful catheter directed bilateral pulmonary artery aspiration thrombectomy. Marliss Coots, MD [facility] Radiology Electronically Signed   By: Marliss Coots M.D.   On: 07/08/2023 06:34   IR THROMBECT PRIM MECH INIT (INCLU) MOD SED Result Date: 07/08/2023 INDICATION: 63 year old female with history of acute, intermediate-high risk pulmonary embolism and bilateral lower extremity deep  vein thrombosis in the setting of COVID infection with persistent dysnpea, hypoxia, tachycardia, and right heart failure status post 1/2 dose t-pa 3 days ago. EXAM: 1. Ultrasound guided vascular access of the right common femoral vein. 2. Bilateral pulmonary arteriography and pulmonary manometry. 3. Bilateral pulmonary artery catheter directed aspiration thrombectomy. COMPARISON:  07/04/2023, 07/07/2023 MEDICATIONS: None. ANESTHESIA/SEDATION: Moderate (conscious) sedation was employed during this procedure. A total of Versed 1.5 mg and Fentanyl 50 mcg was administered intravenously. Moderate Sedation Time: 79 minutes. The patient's level of consciousness and vital signs were monitored continuously by radiology nursing throughout the procedure under my direct supervision. FLUOROSCOPY TIME:  One hundred seventy-one mGy COMPLICATIONS: None immediate. TECHNIQUE: The procedure was performed with the assistance of a my partner, Dr. Gilmer Mor. Informed written consent was obtained from the patient after a thorough discussion of the procedural risks, benefits and alternatives. All questions were addressed. Maximal Sterile Barrier Technique was utilized including caps, mask, sterile gowns, sterile gloves, sterile drape, hand hygiene and skin antiseptic. A timeout was performed prior to the initiation of the procedure. Preprocedure ultrasound evaluation demonstrated patency of  the right common femoral vein. The procedure was planned. Subdermal Local anesthesia was provided the planned needle entry site with 1% lidocaine. Skin nick was made. Under direct ultrasound visualization, the right common femoral vein was accessed with a 21 gauge micropuncture needle. A permanent ultrasound image was captured and stored in the record. A micropuncture sheath was introduced through which a Wholey wire was placed and directed to the inferior vena cava under fluoroscopic guidance. Dilation with an 8 French sheath was performed followed  by placement of 2 ProGlide devices at the 10 o'clock and 2 o'clock positions in pre close fashion. Next, a 24 French sheath was placed with the tip placed in the perihepatic inferior vena cava. An angled pigtail catheter was then inserted over the wire and under fluoroscopic guidance guided to the main pulmonary artery. Pulmonary angiogram was then performed which demonstrated limited perfusion in the right greater than left pulmonary arteries. Pulmonary manometry was performed and measured at 71/30 mmHg with a mean pulmonary artery pressure of 46 mmHg. The [facility] wire was directed to the right pulmonary artery and the pigtail catheter was removed. The select catheter was unable to be advanced into the right pulmonary artery due to tortuosity. Therefore a 7 [place], 70 cm Ansel sheath was inserted over the [facility] wire to the level of the main pulmonary artery. A Rosen wire was then directed into the right inferior pulmonary artery. A selective catheter was then inserted over the wire. The wire was exchanged for short taper superstiff Amplatz wire. The catheter and coaxial sheath were removed. Eight hundred twenty-four [place] FlowTriever aspiration catheter was inserted in the right inferior pulmonary artery and multiple aspirations were performed yielding acute appearing thrombus in the aspiration canister. The catheter was retracted into the right superior pulmonary artery an additional aspirations were performed yielding small volume acute appearing thrombus. Right pulmonary angiogram was then performed which demonstrated significantly improved perfusion to the right upper lobe. There is small volume residual thrombus in the right inferior pulmonary artery. At this point, the patient's tachycardia resolved and hypoxia improved. The aspiration catheter was then retracted into the proximal left pulmonary artery. Aspiration thrombectomy was again performed this location yielding minimal acute appearing thrombus.  Therefore, the curved 20 French aspiration catheter was inserted in coaxial fashion into the left inferior pulmonary artery. Aspiration was repeated which yielded moderate volume acute appearing thrombus in the aspiration canister. Pulmonary manometry was then repeated measuring 48 over 18 mmHg with a mean pulmonary artery pressure of 31 mmHg. Completion pulmonary angiography was performed which demonstrated significantly improved perfusion of the bilateral pulmonary arteries. There was mild persistent perfusion defect in the right inferior lobe. Given difficulty of cannulation of the right inferior pulmonary artery in addition to significant improvement in vital signs, the procedure was terminated. The catheters and wires were removed. The right groin sheath was removed and the Perclose devices were deployed. There was some persistent venous appearing bleeding from the right groin site therefore manual compression was applied and a 0 Prolene stent was applied at the skin in figure-of-eight fashion achieving hemostasis. Sterile bandage was applied. The patient tolerated the procedure well was transferred to the intensive care unit in good condition. IMPRESSION: Technically successful catheter directed bilateral pulmonary artery aspiration thrombectomy. Marliss Coots, MD [facility] Radiology Electronically Signed   By: Marliss Coots M.D.   On: 07/08/2023 06:34   IR US Guide Vasc Access Right Result Date: 07/08/2023 INDICATION: 63 year old female with  history of acute, intermediate-high risk pulmonary embolism and bilateral lower extremity deep vein thrombosis in the setting of COVID infection with persistent dysnpea, hypoxia, tachycardia, and right heart failure status post 1/2 dose t-pa 3 days ago. EXAM: 1. Ultrasound guided vascular access of the right common femoral vein. 2. Bilateral pulmonary arteriography and pulmonary manometry. 3. Bilateral pulmonary artery  catheter directed aspiration thrombectomy. COMPARISON:  07/04/2023, 07/07/2023 MEDICATIONS: None. ANESTHESIA/SEDATION: Moderate (conscious) sedation was employed during this procedure. A total of Versed 1.5 mg and Fentanyl 50 mcg was administered intravenously. Moderate Sedation Time: 79 minutes. The patient's level of consciousness and vital signs were monitored continuously by radiology nursing throughout the procedure under my direct supervision. FLUOROSCOPY TIME:  One hundred seventy-one mGy COMPLICATIONS: None immediate. TECHNIQUE: The procedure was performed with the assistance of a my partner, Dr. Gilmer Mor. Informed written consent was obtained from the patient after a thorough discussion of the procedural risks, benefits and alternatives. All questions were addressed. Maximal Sterile Barrier Technique was utilized including caps, mask, sterile gowns, sterile gloves, sterile drape, hand hygiene and skin antiseptic. A timeout was performed prior to the initiation of the procedure. Preprocedure ultrasound evaluation demonstrated patency of the right common femoral vein. The procedure was planned. Subdermal Local anesthesia was provided the planned needle entry site with 1% lidocaine. Skin nick was made. Under direct ultrasound visualization, the right common femoral vein was accessed with a 21 gauge micropuncture needle. A permanent ultrasound image was captured and stored in the record. A micropuncture sheath was introduced through which a Wholey wire was placed and directed to the inferior vena cava under fluoroscopic guidance. Dilation with an 8 French sheath was performed followed by placement of 2 ProGlide devices at the 10 o'clock and 2 o'clock positions in pre close fashion. Next, a 24 French sheath was placed with the tip placed in the perihepatic inferior vena cava. An angled pigtail catheter was then inserted over the wire and under fluoroscopic guidance guided to the main pulmonary artery.  Pulmonary angiogram was then performed which demonstrated limited perfusion in the right greater than left pulmonary arteries. Pulmonary manometry was performed and measured at 71/30 mmHg with a mean pulmonary artery pressure of 46 mmHg. The [facility] wire was directed to the right pulmonary artery and the pigtail catheter was removed. The select catheter was unable to be advanced into the right pulmonary artery due to tortuosity. Therefore a 7 [place], 70 cm Ansel sheath was inserted over the [facility] wire to the level of the main pulmonary artery. A Rosen wire was then directed into the right inferior pulmonary artery. A selective catheter was then inserted over the wire. The wire was exchanged for short taper superstiff Amplatz wire. The catheter and coaxial sheath were removed. Eight hundred twenty-four [place] FlowTriever aspiration catheter was inserted in the right inferior pulmonary artery and multiple aspirations were performed yielding acute appearing thrombus in the aspiration canister. The catheter was retracted into the right superior pulmonary artery an additional aspirations were performed yielding small volume acute appearing thrombus. Right pulmonary angiogram was then performed which demonstrated significantly improved perfusion to the right upper lobe. There is small volume residual thrombus in the right inferior pulmonary artery. At this point, the patient's tachycardia resolved and hypoxia improved. The aspiration catheter was then retracted into the proximal left pulmonary artery. Aspiration thrombectomy was again performed this location yielding minimal acute appearing thrombus. Therefore, the curved 20 French aspiration catheter was inserted in coaxial fashion into the left inferior pulmonary  artery. Aspiration was repeated which yielded moderate volume acute appearing thrombus in the aspiration canister. Pulmonary manometry was then repeated measuring 48 over 18 mmHg with a mean pulmonary artery  pressure of 31 mmHg. Completion pulmonary angiography was performed which demonstrated significantly improved perfusion of the bilateral pulmonary arteries. There was mild persistent perfusion defect in the right inferior lobe. Given difficulty of cannulation of the right inferior pulmonary artery in addition to significant improvement in vital signs, the procedure was terminated. The catheters and wires were removed. The right groin sheath was removed and the Perclose devices were deployed. There was some persistent venous appearing bleeding from the right groin site therefore manual compression was applied and a 0 Prolene stent was applied at the skin in figure-of-eight fashion achieving hemostasis. Sterile bandage was applied. The patient tolerated the procedure well was transferred to the intensive care unit in good condition. IMPRESSION: Technically successful catheter directed bilateral pulmonary artery aspiration thrombectomy. Marliss Coots, MD [facility] Radiology Electronically Signed   By: Marliss Coots M.D.   On: 07/08/2023 06:34   IR Angiogram Selective Each Additional Vessel Result Date: 07/08/2023 INDICATION: 63 year old female with history of acute, intermediate-high risk pulmonary embolism and bilateral lower extremity deep vein thrombosis in the setting of COVID infection with persistent dysnpea, hypoxia, tachycardia, and right heart failure status post 1/2 dose t-pa 3 days ago. EXAM: 1. Ultrasound guided vascular access of the right common femoral vein. 2. Bilateral pulmonary arteriography and pulmonary manometry. 3. Bilateral pulmonary artery catheter directed aspiration thrombectomy. COMPARISON:  07/04/2023, 07/07/2023 MEDICATIONS: None. ANESTHESIA/SEDATION: Moderate (conscious) sedation was employed during this procedure. A total of Versed 1.5 mg and Fentanyl 50 mcg was administered intravenously. Moderate Sedation Time: 79 minutes. The  patient's level of consciousness and vital signs were monitored continuously by radiology nursing throughout the procedure under my direct supervision. FLUOROSCOPY TIME:  One hundred seventy-one mGy COMPLICATIONS: None immediate. TECHNIQUE: The procedure was performed with the assistance of a my partner, Dr. Gilmer Mor. Informed written consent was obtained from the patient after a thorough discussion of the procedural risks, benefits and alternatives. All questions were addressed. Maximal Sterile Barrier Technique was utilized including caps, mask, sterile gowns, sterile gloves, sterile drape, hand hygiene and skin antiseptic. A timeout was performed prior to the initiation of the procedure. Preprocedure ultrasound evaluation demonstrated patency of the right common femoral vein. The procedure was planned. Subdermal Local anesthesia was provided the planned needle entry site with 1% lidocaine. Skin nick was made. Under direct ultrasound visualization, the right common femoral vein was accessed with a 21 gauge micropuncture needle. A permanent ultrasound image was captured and stored in the record. A micropuncture sheath was introduced through which a Wholey wire was placed and directed to the inferior vena cava under fluoroscopic guidance. Dilation with an 8 French sheath was performed followed by placement of 2 ProGlide devices at the 10 o'clock and 2 o'clock positions in pre close fashion. Next, a 24 French sheath was placed with the tip placed in the perihepatic inferior vena cava. An angled pigtail catheter was then inserted over the wire and under fluoroscopic guidance guided to the main pulmonary artery. Pulmonary angiogram was then performed which demonstrated limited perfusion in the right greater than left pulmonary arteries. Pulmonary manometry was performed and measured at 71/30 mmHg with a mean pulmonary artery pressure of 46 mmHg. The [facility] wire was directed to the right pulmonary artery and the  pigtail catheter was removed.  The select catheter was unable to be advanced into the right pulmonary artery due to tortuosity. Therefore a 7 [place], 70 cm Ansel sheath was inserted over the [facility] wire to the level of the main pulmonary artery. A Rosen wire was then directed into the right inferior pulmonary artery. A selective catheter was then inserted over the wire. The wire was exchanged for short taper superstiff Amplatz wire. The catheter and coaxial sheath were removed. Eight hundred twenty-four [place] FlowTriever aspiration catheter was inserted in the right inferior pulmonary artery and multiple aspirations were performed yielding acute appearing thrombus in the aspiration canister. The catheter was retracted into the right superior pulmonary artery an additional aspirations were performed yielding small volume acute appearing thrombus. Right pulmonary angiogram was then performed which demonstrated significantly improved perfusion to the right upper lobe. There is small volume residual thrombus in the right inferior pulmonary artery. At this point, the patient's tachycardia resolved and hypoxia improved. The aspiration catheter was then retracted into the proximal left pulmonary artery. Aspiration thrombectomy was again performed this location yielding minimal acute appearing thrombus. Therefore, the curved 20 French aspiration catheter was inserted in coaxial fashion into the left inferior pulmonary artery. Aspiration was repeated which yielded moderate volume acute appearing thrombus in the aspiration canister. Pulmonary manometry was then repeated measuring 48 over 18 mmHg with a mean pulmonary artery pressure of 31 mmHg. Completion pulmonary angiography was performed which demonstrated significantly improved perfusion of the bilateral pulmonary arteries. There was mild persistent perfusion defect in the right inferior lobe. Given difficulty of cannulation of the right inferior pulmonary artery in  addition to significant improvement in vital signs, the procedure was terminated. The catheters and wires were removed. The right groin sheath was removed and the Perclose devices were deployed. There was some persistent venous appearing bleeding from the right groin site therefore manual compression was applied and a 0 Prolene stent was applied at the skin in figure-of-eight fashion achieving hemostasis. Sterile bandage was applied. The patient tolerated the procedure well was transferred to the intensive care unit in good condition. IMPRESSION: Technically successful catheter directed bilateral pulmonary artery aspiration thrombectomy. Marliss Coots, MD [facility] Radiology Electronically Signed   By: Marliss Coots M.D.   On: 07/08/2023 06:34   IR Angiogram Selective Each Additional Vessel Result Date: 07/08/2023 INDICATION: 63 year old female with history of acute, intermediate-high risk pulmonary embolism and bilateral lower extremity deep vein thrombosis in the setting of COVID infection with persistent dysnpea, hypoxia, tachycardia, and right heart failure status post 1/2 dose t-pa 3 days ago. EXAM: 1. Ultrasound guided vascular access of the right common femoral vein. 2. Bilateral pulmonary arteriography and pulmonary manometry. 3. Bilateral pulmonary artery catheter directed aspiration thrombectomy. COMPARISON:  07/04/2023, 07/07/2023 MEDICATIONS: None. ANESTHESIA/SEDATION: Moderate (conscious) sedation was employed during this procedure. A total of Versed 1.5 mg and Fentanyl 50 mcg was administered intravenously. Moderate Sedation Time: 79 minutes. The patient's level of consciousness and vital signs were monitored continuously by radiology nursing throughout the procedure under my direct supervision. FLUOROSCOPY TIME:  One hundred seventy-one mGy COMPLICATIONS: None immediate. TECHNIQUE: The procedure was performed with the assistance of a my partner, Dr.  Gilmer Mor. Informed written consent was obtained from the patient after a thorough discussion of the procedural risks, benefits and alternatives. All questions were addressed. Maximal Sterile Barrier Technique was utilized including caps, mask, sterile gowns, sterile gloves, sterile drape, hand hygiene and skin antiseptic. A timeout was performed prior  to the initiation of the procedure. Preprocedure ultrasound evaluation demonstrated patency of the right common femoral vein. The procedure was planned. Subdermal Local anesthesia was provided the planned needle entry site with 1% lidocaine. Skin nick was made. Under direct ultrasound visualization, the right common femoral vein was accessed with a 21 gauge micropuncture needle. A permanent ultrasound image was captured and stored in the record. A micropuncture sheath was introduced through which a Wholey wire was placed and directed to the inferior vena cava under fluoroscopic guidance. Dilation with an 8 French sheath was performed followed by placement of 2 ProGlide devices at the 10 o'clock and 2 o'clock positions in pre close fashion. Next, a 24 French sheath was placed with the tip placed in the perihepatic inferior vena cava. An angled pigtail catheter was then inserted over the wire and under fluoroscopic guidance guided to the main pulmonary artery. Pulmonary angiogram was then performed which demonstrated limited perfusion in the right greater than left pulmonary arteries. Pulmonary manometry was performed and measured at 71/30 mmHg with a mean pulmonary artery pressure of 46 mmHg. The [facility] wire was directed to the right pulmonary artery and the pigtail catheter was removed. The select catheter was unable to be advanced into the right pulmonary artery due to tortuosity. Therefore a 7 [place], 70 cm Ansel sheath was inserted over the [facility] wire to the level of the main pulmonary artery. A Rosen wire was then directed into the right inferior pulmonary  artery. A selective catheter was then inserted over the wire. The wire was exchanged for short taper superstiff Amplatz wire. The catheter and coaxial sheath were removed. Eight hundred twenty-four [place] FlowTriever aspiration catheter was inserted in the right inferior pulmonary artery and multiple aspirations were performed yielding acute appearing thrombus in the aspiration canister. The catheter was retracted into the right superior pulmonary artery an additional aspirations were performed yielding small volume acute appearing thrombus. Right pulmonary angiogram was then performed which demonstrated significantly improved perfusion to the right upper lobe. There is small volume residual thrombus in the right inferior pulmonary artery. At this point, the patient's tachycardia resolved and hypoxia improved. The aspiration catheter was then retracted into the proximal left pulmonary artery. Aspiration thrombectomy was again performed this location yielding minimal acute appearing thrombus. Therefore, the curved 20 French aspiration catheter was inserted in coaxial fashion into the left inferior pulmonary artery. Aspiration was repeated which yielded moderate volume acute appearing thrombus in the aspiration canister. Pulmonary manometry was then repeated measuring 48 over 18 mmHg with a mean pulmonary artery pressure of 31 mmHg. Completion pulmonary angiography was performed which demonstrated significantly improved perfusion of the bilateral pulmonary arteries. There was mild persistent perfusion defect in the right inferior lobe. Given difficulty of cannulation of the right inferior pulmonary artery in addition to significant improvement in vital signs, the procedure was terminated. The catheters and wires were removed. The right groin sheath was removed and the Perclose devices were deployed. There was some persistent venous appearing bleeding from the right groin site therefore manual compression was applied  and a 0 Prolene stent was applied at the skin in figure-of-eight fashion achieving hemostasis. Sterile bandage was applied. The patient tolerated the procedure well was transferred to the intensive care unit in good condition. IMPRESSION: Technically successful catheter directed bilateral pulmonary artery aspiration thrombectomy. Marliss Coots, MD [facility] Radiology Electronically Signed   By: Marliss Coots M.D.   On: 07/08/2023 06:34   CT Angio Abd/Pel  w/ and/or w/o Result Date: 07/07/2023 CLINICAL DATA:  63 year old female with acute PE on CTA 3 days ago. Saddle embolus at that time. Extensive DVT. Query IVC thrombus. Re-evaluate PE. EXAM: CTA ABDOMEN AND PELVIS WITHOUT AND WITH CONTRAST TECHNIQUE: Multidetector CT imaging of the abdomen and pelvis was performed using the standard protocol during bolus administration of intravenous contrast. Multiplanar reconstructed images and MIPs were obtained and reviewed to evaluate the vascular anatomy. RADIATION DOSE REDUCTION: This exam was performed according to the departmental dose-optimization program which includes automated exposure control, adjustment of the mA and/or kV according to patient size and/or use of iterative reconstruction technique. CONTRAST:  75mL OMNIPAQUE IOHEXOL 350 MG/ML SOLN COMPARISON:  CTA chest today reported separately. CTA chest on 07/04/2023. FINDINGS: VASCULAR Reflux of right atrial contrast into the hepatic veins and hepatic IVC appearing unchanged from the CTA on 07/04/2023. Otherwise no inferior vena cava contrast is present. Venous Vascular patency is not evaluated in the absence of IV contrast. Abdominal aorta timed vascular contrast. Normal caliber abdominal aorta and major arterial structures in the abdomen and pelvis appear patent and unremarkable. Review of the MIP images confirms the above findings. NON-VASCULAR Lower chest: Lower lobe PE stable to that reported separately  today. Hepatobiliary: Cholecystectomy. Largely noncontrast appearance of the liver except for hepatic venous contrast reflux. No liver abnormality identified. Pancreas: Negative. Spleen: Negative. Adrenals/Urinary Tract: Bilateral low-density adrenal gland thickening compatible with hyperplasia. Nonobstructed kidneys with symmetric renal enhancement. Small exophytic simple fluid density left renal midpole cyst suspected on series 5, image 70 (no follow-up imaging recommended). Decompressed ureters and bladder. Numerous pelvic phleboliths. Stomach/Bowel: Extensive large bowel diverticulosis from the hepatic flexure through the sigmoid colon. No active inflammation identified in those segments. Normal appendix on series 5, image 119. Decompressed terminal ileum and no dilated small bowel. Decompressed stomach and duodenum. No free air or free fluid. Lymphatic: No lymphadenopathy in the abdomen or pelvis. Reproductive: Within normal limits. Other: No pelvis free fluid. Musculoskeletal: Widespread severe lumbar spine degeneration. Extensive vacuum disc and vacuum facet degenerative changes. Multifactorial spinal stenosis appears maximal in severe at L4-L5. No acute or suspicious osseous abnormality identified. IMPRESSION: 1. Aortic vascular contrast timing on this CTA abdomen and pelvis. Vena cava, Venous patency is not evaluated. A routine CT Abdomen and Pelvis with standard portal venous and delayed renal excretory phase IV contrast timing would best evaluate the IVC. Reflux of right heart contrast to the hepatic veins appears unchanged since 07/04/2023. 2. Abnormal CTA Chest today is reported separately. 3. No other acute or inflammatory process identified in the abdomen or pelvis. Extensive large bowel diverticulosis. Severe lumbar spine degeneration with multifactorial spinal stenosis. Electronically Signed   By: Odessa Fleming M.D.   On: 07/07/2023 12:36   CT Angio Chest Pulmonary Embolism (PE) W or WO Contrast Result  Date: 07/07/2023 CLINICAL DATA:  63 year old female with acute PE on CTA 3 days ago. Saddle embolus at that time. Extensive DVT. Query IVC thrombus. Re-evaluate PE. EXAM: CT ANGIOGRAPHY CHEST WITH CONTRAST TECHNIQUE: Multidetector CT imaging of the chest was performed using the standard protocol during bolus administration of intravenous contrast. Multiplanar CT image reconstructions and MIPs were obtained to evaluate the vascular anatomy. RADIATION DOSE REDUCTION: This exam was performed according to the departmental dose-optimization program which includes automated exposure control, adjustment of the mA and/or kV according to patient size and/or use of iterative reconstruction technique. CONTRAST:  75mL OMNIPAQUE IOHEXOL 350 MG/ML SOLN COMPARISON:  CTA chest 07/04/2023. FINDINGS: Cardiovascular: Good  contrast bolus timing in the pulmonary arterial tree. Central pulmonary artery enlargement is stable since 07/04/2023. Saddle thrombus has resolved since that time but there is ongoing bilateral lobar pulmonary embolus, bulky in the right lung and left lower lobe. RV LV ratio remains abnormal (series 5, image 185. No pericardial effusion. Little contrast in the thoracic aorta. Mediastinum/Nodes: Stable and negative, no mediastinal mass or lymphadenopathy. Lungs/Pleura: Major airways remain patent. Lung volumes are mildly improved, less respiratory motion. No discrete pulmonary infarct. No consolidation or pleural effusion. Upper Abdomen: CTA abdomen and pelvis today reported separately. Musculoskeletal: Stable. No acute or suspicious osseous lesion in the chest. Lower cervical spine and midthoracic endplate degeneration and spurring. Review of the MIP images confirms the above findings. IMPRESSION: 1. Ongoing bilateral lobar Pulmonary Emboli. Saddle thrombus has resolved since 07/04/2023, central pulmonary artery enlargement and abnormal RV LV ratio are unchanged. 2. No pulmonary infarct.  No pleural or pericardial  effusion. 3. CTA Abdomen and Pelvis today reported separately. Electronically Signed   By: Odessa Fleming M.D.   On: 07/07/2023 12:27   ECHOCARDIOGRAM COMPLETE Result Date: 07/06/2023    ECHOCARDIOGRAM REPORT   Patient Name:   STAVROULA ROHDE Date of Exam: 07/06/2023 Medical Rec #:  [id number]            Height:       60.0 in Accession #:    [id number]           Weight:       203.3 lb Date of Birth:  [birth year]           BSA:          1.879 m Patient Age:    62 years             BP:           153/86 mmHg Patient Gender: F                    HR:           102 bpm. Exam Location:  Inpatient Procedure: 2D Echo, Cardiac Doppler, Color Doppler and Intracardiac            Opacification Agent Indications:    Pulmonary Embolus I26.09  History:        Patient has no prior history of Echocardiogram examinations.                 Risk Factors:Hypertension.  Sonographer:    Lucendia Herrlich RCS Referring Phys: [phone] PETER E BABCOCK IMPRESSIONS  1. Left ventricular ejection fraction, by estimation, is >75%. The left ventricle has hyperdynamic function. The left ventricle has no regional wall motion abnormalities. Left ventricular diastolic parameters are consistent with Grade I diastolic dysfunction (impaired relaxation).  2. Right ventricular systolic function is severely reduced. The right ventricular size is moderately enlarged. There is mildly elevated pulmonary artery systolic pressure. The estimated right ventricular systolic pressure is 43.5 mmHg.  3. Right atrial size was mildly dilated.  4. The mitral valve is normal in structure. No evidence of mitral valve regurgitation.  5. The aortic valve is tricuspid. Aortic valve regurgitation is not visualized.  6. The inferior vena cava is dilated in size with <50% respiratory variability, suggesting right atrial pressure of 15 mmHg. Comparison(s): No prior Echocardiogram. Conclusion(s)/Recommendation(s): Findings suggestive of significant RV strain in the setting of pulmonary  embolus. FINDINGS  Left Ventricle: Left ventricular ejection fraction, by estimation, is >75%. The left ventricle has hyperdynamic function. The  left ventricle has no regional wall motion abnormalities. Definity contrast agent was given IV to delineate the left ventricular endocardial borders. The left ventricular internal cavity size was normal in size. There is no left ventricular hypertrophy. Left ventricular diastolic parameters are consistent with Grade I diastolic dysfunction (impaired relaxation). Indeterminate filling pressures. Right Ventricle: The right ventricular size is moderately enlarged. No increase in right ventricular wall thickness. Right ventricular systolic function is severely reduced. There is mildly elevated pulmonary artery systolic pressure. The tricuspid regurgitant velocity is 2.67 m/s, and with an assumed right atrial pressure of 15 mmHg, the estimated right ventricular systolic pressure is 43.5 mmHg. Left Atrium: Left atrial size was normal in size. Right Atrium: Right atrial size was mildly dilated. Pericardium: There is no evidence of pericardial effusion. Mitral Valve: The mitral valve is normal in structure. No evidence of mitral valve regurgitation. Tricuspid Valve: The tricuspid valve is grossly normal. Tricuspid valve regurgitation is trivial. Aortic Valve: The aortic valve is tricuspid. Aortic valve regurgitation is not visualized. Aortic valve peak gradient measures 9.9 mmHg. Pulmonic Valve: The pulmonic valve was normal in structure. Pulmonic valve regurgitation is not visualized. Aorta: The aortic root and ascending aorta are structurally normal, with no evidence of dilitation. Venous: The inferior vena cava is dilated in size with less than 50% respiratory variability, suggesting right atrial pressure of 15 mmHg. IAS/Shunts: No atrial level shunt detected by color flow Doppler.  LEFT VENTRICLE PLAX 2D LVIDd:         3.80 cm   Diastology LVIDs:         1.50 cm   LV e' medial:     8.27 cm/s LV PW:         1.00 cm   LV E/e' medial:  10.7 LV IVS:        0.90 cm   LV e' lateral:   11.50 cm/s LVOT diam:     1.90 cm   LV E/e' lateral: 7.7 LV SV:         43 LV SV Index:   23 LVOT Area:     2.84 cm  RIGHT VENTRICLE             IVC RV S prime:     16.70 cm/s  IVC diam: 2.10 cm TAPSE (M-mode): 1.8 cm LEFT ATRIUM           Index        RIGHT ATRIUM           Index LA diam:      2.60 cm 1.38 cm/m   RA Area:     17.10 cm LA Vol (A2C): 28.5 ml 15.16 ml/m  RA Volume:   47.10 ml  25.06 ml/m LA Vol (A4C): 25.7 ml 13.67 ml/m  AORTIC VALVE AV Area (Vmax): 1.77 cm AV Vmax:        157.00 cm/s AV Peak Grad:   9.9 mmHg LVOT Vmax:      97.82 cm/s LVOT Vmean:     62.575 cm/s LVOT VTI:       0.153 m  AORTA Ao Root diam: 2.50 cm Ao Asc diam:  3.10 cm MITRAL VALVE                TRICUSPID VALVE MV Area (PHT): 5.31 cm     TR Peak grad:   28.5 mmHg MV Decel Time: 143 msec     TR Vmax:        267.00 cm/s MV E velocity:  88.10 cm/s MV A velocity: 128.00 cm/s  SHUNTS MV E/A ratio:  0.69         Systemic VTI:  0.15 m                             Systemic Diam: 1.90 cm Zoila Shutter MD Electronically signed by Zoila Shutter MD Signature Date/Time: 07/06/2023/4:39:02 PM    Final    VAS [place] IVC/ILIAC (VENOUS ONLY) Result Date: 07/06/2023 IVC/ILIAC STUDY Patient Name:  Solyana Nonaka  Date of Exam:   07/06/2023 Medical Rec #: [id number]             Accession #:    [id number] Date of Birth: 20-Jul-1960            Patient Gender: F Patient Age:   50 years Exam Location:  [facility] Procedure:      VAS [place] IVC/ILIAC (VENOUS ONLY) Referring Phys: --------------------------------------------------------------------------------  Indications: Extensive DVT and massive PE Other Factors: Covid 19 infection.  Comparison Study: No prior study on file Performing Technologist: Sherren Kerns RVS  Examination Guidelines: A complete evaluation includes B-mode imaging, spectral Doppler, color Doppler, and power Doppler as  needed of all accessible portions of each vessel. Bilateral testing is considered an integral part of a complete examination. Limited examinations for reoccurring indications may be performed as noted.  IVC/Iliac Findings: +----------+------+--------+----------------+    IVC    PatentThrombus    Comments     +----------+------+--------+----------------+ IVC Prox         acute  partial thrombus +----------+------+--------+----------------+ IVC Mid          acute  partial thrombus +----------+------+--------+----------------+ IVC Distal       acute  partial thrombus +----------+------+--------+----------------+  +------------------+---------+-----------+---------+-----------+---------------+        CIV        RT-PatentRT-ThrombusLT-PatentLT-Thrombus   Comments     +------------------+---------+-----------+---------+-----------+---------------+ Common Iliac Prox  patent                         acute       partial                                                                  thrombus     +------------------+---------+-----------+---------+-----------+---------------+ Common Iliac Mid   patent                         acute       partial                                                                  thrombus     +------------------+---------+-----------+---------+-----------+---------------+ Common Iliac       patent                         acute       partial     Distal  thrombus     +------------------+---------+-----------+---------+-----------+---------------+  +-----------------+---------+-----------+---------+-----------+----------------+        EIV       RT-PatentRT-ThrombusLT-PatentLT-Thrombus    Comments     +-----------------+---------+-----------+---------+-----------+----------------+ External Iliac    patent                         acute   partial thrombus Vein Prox                                                                  +-----------------+---------+-----------+---------+-----------+----------------+ External Iliac    patent                         acute   partial thrombus Vein Mid                                                                  +-----------------+---------+-----------+---------+-----------+----------------+ External Iliac    patent                         acute   partial thrombus Vein Distal                                                               +-----------------+---------+-----------+---------+-----------+----------------+  Summary: IVC/Iliac: Partial acute thrombus noted throughout the left external and common iliac veins and the IVC.  *See table(s) above for measurements and observations.  Electronically signed by Sherald Hess MD on 07/06/2023 at 1:51:45 PM.    Final    VAS [place] LOWER EXTREMITY VENOUS (DVT) Result Date: 07/06/2023  Lower Venous DVT Study Patient Name:  MARQUISHA NIKOLOV  Date of Exam:   07/06/2023 Medical Rec #: [id number]             Accession #:    [id number] Date of Birth: 06-06-1961            Patient Gender: F Patient Age:   45 years Exam Location:  [facility] Procedure:      VAS [place] LOWER EXTREMITY VENOUS (DVT) Referring Phys: Melody Comas --------------------------------------------------------------------------------  Indications: Massive Pulmonary embolism, SOB, and Covid 19 infection.  Anticoagulation: Heparin. Comparison Study: No prior study on file Performing Technologist: Sherren Kerns RVS  Examination Guidelines: A complete evaluation includes B-mode imaging, spectral Doppler, color Doppler, and power Doppler as needed of all accessible portions of each vessel. Bilateral testing is considered an integral part of a complete examination. Limited examinations for reoccurring indications may be performed as noted. The reflux portion of the exam is performed with the  patient in reverse Trendelenburg.  +---------+---------------+---------+-----------+---------------+-------------+ RIGHT    CompressibilityPhasicitySpontaneityProperties     Thrombus  Aging         +---------+---------------+---------+-----------+---------------+-------------+ CFV      Full           Yes      No         pulsatile                                                                waveform                     +---------+---------------+---------+-----------+---------------+-------------+ SFJ      Full                                                            +---------+---------------+---------+-----------+---------------+-------------+ FV Prox  Partial                                           Acute         +---------+---------------+---------+-----------+---------------+-------------+ FV Mid   Partial        No       No                        Acute         +---------+---------------+---------+-----------+---------------+-------------+ FV DistalPartial        Yes      No         pulsatile      Acute                                                     waveform                     +---------+---------------+---------+-----------+---------------+-------------+ PFV      Partial                                           Acute         +---------+---------------+---------+-----------+---------------+-------------+ POP      Partial        No       No                        Acute         +---------+---------------+---------+-----------+---------------+-------------+ PTV      None                                              Acute         +---------+---------------+---------+-----------+---------------+-------------+ PERO     None  Acute          +---------+---------------+---------+-----------+---------------+-------------+ Gastroc  Partial        Yes      No         pulsatile      Acute                                                     waveform                     +---------+---------------+---------+-----------+---------------+-------------+   +---------+---------------+---------+-----------+---------------+-------------+ LEFT     CompressibilityPhasicitySpontaneityProperties     Thrombus                                                                 Aging         +---------+---------------+---------+-----------+---------------+-------------+ CFV      Partial        Yes      No         pulsatile      Acute                                                     waveform                     +---------+---------------+---------+-----------+---------------+-------------+ SFJ      Partial                                           Acute         +---------+---------------+---------+-----------+---------------+-------------+ FV Prox  Partial        No       No                        Acute         +---------+---------------+---------+-----------+---------------+-------------+ FV Mid   None           No       No                        Acute         +---------+---------------+---------+-----------+---------------+-------------+ FV DistalNone           No       No                        Acute         +---------+---------------+---------+-----------+---------------+-------------+ PFV      Full           Yes      No         pulsatile      Acute  waveform                     +---------+---------------+---------+-----------+---------------+-------------+ POP      None           No       No                        Acute         +---------+---------------+---------+-----------+---------------+-------------+ PTV      None                                               Acute         +---------+---------------+---------+-----------+---------------+-------------+ PERO     None                                              Acute         +---------+---------------+---------+-----------+---------------+-------------+ Gastroc  None           No       No                        Acute         +---------+---------------+---------+-----------+---------------+-------------+     Summary: RIGHT: - Findings consistent with acute deep vein thrombosis involving the right femoral vein, right proximal profunda vein, right popliteal vein, right posterior tibial veins, and right peroneal veins. Findings consistent with acute intramuscular thrombosis involving the right gastrocnemius veins. - No cystic structure found in the popliteal fossa. pulsatile waveforms noted  LEFT: - Findings consistent with acute deep vein thrombosis involving the left common femoral vein, SF junction, left femoral vein, left popliteal vein, left posterior tibial veins, and left peroneal veins. Findings consistent with acute intramuscular thrombosis involving the left gastrocnemius veins. - No cystic structure found in the popliteal fossa. Pulsatile waveforms noted.  *See table(s) above for measurements and observations. Electronically signed by Sherald Hess MD on 07/06/2023 at 1:51:07 PM.    Final    CT Angio Chest PE W and/or Wo Contrast Result Date: 07/04/2023 CLINICAL DATA:  Increasing shortness of breath EXAM: CT ANGIOGRAPHY CHEST WITH CONTRAST TECHNIQUE: Multidetector CT imaging of the chest was performed using the standard protocol during bolus administration of intravenous contrast. Multiplanar CT image reconstructions and MIPs were obtained to evaluate the vascular anatomy. RADIATION DOSE REDUCTION: This exam was performed according to the departmental dose-optimization program which includes automated exposure control, adjustment of the mA and/or kV according  to patient size and/or use of iterative reconstruction technique. CONTRAST:  75mL OMNIPAQUE IOHEXOL 350 MG/ML SOLN COMPARISON:  Chest x-ray from earlier in the same day. FINDINGS: Cardiovascular: Thoracic aorta is within normal limits. No aneurysmal dilatation or dissection is noted. Pulmonary artery is enlarged centrally with extensive bilateral pulmonary embolus with evidence of a saddle component centrally. Right ventricle is significantly enlarged consistent with right heart strain. RV/LV ratio calculates at 3. No coronary calcifications are noted. Contrast reflux into the hepatic veins is noted consistent with a degree of elevated right heart pressures. Mediastinum/Nodes: Thoracic inlet is within normal limits. No hilar or mediastinal adenopathy is noted. The esophagus as visualized is within normal limits. Lungs/Pleura: Lungs are well  aerated bilaterally. No focal infiltrate or sizable effusion is seen. Upper Abdomen: Visualized upper abdomen shows some nodularity of the adrenal glands bilaterally. Musculoskeletal: No chest wall abnormality. No acute or significant osseous findings. Review of the MIP images confirms the above findings. IMPRESSION: Positive for acute bilateral PE with CT evidence of right heart strain (RV/LV Ratio = 3) consistent with at least submassive (intermediate risk) PE. The presence of right heart strain has been associated with an increased risk of morbidity and mortality. Please refer to the "Code PE Focused" order set in [facility] Critical Value/emergent results were called by telephone at the time of interpretation on 07/04/2023 at 9:26 pm to Dr. Shaune Pollack , who verbally acknowledged these results. Electronically Signed   By: Alcide Clever M.D.   On: 07/04/2023 21:31   DG Chest Port 1 View Result Date: 07/04/2023 CLINICAL DATA:  Shortness of breath with positive COVID test EXAM: PORTABLE CHEST 1 VIEW COMPARISON:  None Available. FINDINGS: Cardiac shadow is within normal limits. Lungs  are well aerated bilaterally. Mild right perihilar opacity is noted consistent with early infiltrate. No sizable effusion is seen. No bony abnormality is noted. IMPRESSION: Mild right perihilar infiltrate. Electronically Signed   By: Alcide Clever M.D.   On: 07/04/2023 19:55    Labs Recent Results (from the past 2160 hours)  Basic metabolic panel     Status: Abnormal   Collection Time: 07/04/23  7:37 PM  Result Value Ref Range   Sodium 131 (L) 135 - 145 mmol/L   Potassium 3.6 3.5 - 5.1 mmol/L   Chloride 96 (L) 98 - 111 mmol/L   CO2 20 (L) 22 - 32 mmol/L   Glucose, Bld 235 (H) 70 - 99 mg/dL    Comment: Glucose reference range applies only to samples taken after fasting for at least 8 hours.   BUN 30 (H) 8 - 23 mg/dL   Creatinine, Ser 1.61 (H) 0.44 - 1.00 mg/dL   Calcium 8.7 (L) 8.9 - 10.3 mg/dL   GFR, Estimated 37 (L) >60 mL/min    Comment: (NOTE) Calculated using the CKD-EPI Creatinine Equation (2021)    Anion gap 15 5 - 15    Comment: Performed at [facility], [address] [place], [place] [address]  CBC     Status: Abnormal   Collection Time: 07/04/23  7:37 PM  Result Value Ref Range   WBC 13.0 (H) 4.0 - 10.5 K/uL   RBC 4.72 3.87 - 5.11 MIL/uL   Hemoglobin 13.1 12.0 - 15.0 g/dL   HCT 54.0 98.1 - 19.1 %   MCV 85.6 80.0 - 100.0 fL   MCH 27.8 26.0 - 34.0 pg   MCHC 32.4 30.0 - 36.0 g/dL   RDW 47.8 29.5 - 62.1 %   Platelets 131 (L) 150 - 400 K/uL   nRBC 0.0 0.0 - 0.2 %    Comment: Performed at [facility], [address] [place], [place] [address]  Troponin I (High Sensitivity)     Status: Abnormal   Collection Time: 07/04/23  7:37 PM  Result Value Ref Range   Troponin I (High Sensitivity) 174 (HH) <18 ng/L    Comment: CRITICAL RESULT CALLED TO, READ BACK BY AND VERIFIED WITH Martyn Ehrich RN @ 2018 07/04/23 [facility] (NOTE) Elevated high sensitivity troponin I (hsTnI) values and significant  changes across serial measurements may suggest ACS but many other   chronic and acute conditions are known to elevate hsTnI results.  Refer to the "  Links" section for chest pain algorithms and additional  guidance. Performed at [facility], [address] [place], [place] [address]   Brain natriuretic peptide     Status: Abnormal   Collection Time: 07/04/23  7:37 PM  Result Value Ref Range   B Natriuretic Peptide 2,240.2 (H) 0.0 - 100.0 pg/mL    Comment: Performed at [facility], [address] [place], [place] [address]  Hepatic function panel     Status: None   Collection Time: 07/04/23  8:22 PM  Result Value Ref Range   Total Protein 7.6 6.5 - 8.1 g/dL   Albumin 3.6 3.5 - 5.0 g/dL   AST 29 15 - 41 U/L   ALT 24 0 - 44 U/L   Alkaline Phosphatase 78 38 - 126 U/L   Total Bilirubin 0.6 0.0 - 1.2 mg/dL   Bilirubin, Direct 0.1 0.0 - 0.2 mg/dL   Indirect Bilirubin 0.5 0.3 - 0.9 mg/dL    Comment: Performed at [facility], [address] [place], [place] [address]  Blood culture (routine x 2)     Status: None   Collection Time: 07/04/23  8:22 PM   Specimen: BLOOD  Result Value Ref Range   Specimen Description BLOOD RIGHT AC    Special Requests      BOTTLES DRAWN AEROBIC AND ANAEROBIC Blood Culture results may not be optimal due to an inadequate volume of blood received in culture bottles   Culture      NO GROWTH 5 DAYS Performed at [facility], [address] [place], [place] [address]    Report Status 07/09/2023 FINAL   Blood culture (routine x 2)     Status: None   Collection Time: 07/04/23  8:22 PM   Specimen: BLOOD  Result Value Ref Range   Specimen Description BLOOD RIGHT HAND    Special Requests      BOTTLES DRAWN AEROBIC AND ANAEROBIC Blood Culture results may not be optimal due to an inadequate volume of blood received in culture bottles   Culture      NO GROWTH 5 DAYS Performed at [facility], [address] [place], [place] [address]    Report Status 07/09/2023 FINAL    Lactic acid, plasma     Status: Abnormal   Collection Time: 07/04/23  8:22 PM  Result Value Ref Range   Lactic Acid, Venous 4.2 (HH) 0.5 - 1.9 mmol/L    Comment: CRITICAL RESULT CALLED TO, READ BACK BY AND VERIFIED WITH Martyn Ehrich RN @ 2115 07/04/23 [facility] Performed at [facility], [address] [place], [place] [address]   D-dimer, quantitative     Status: Abnormal   Collection Time: 07/04/23  8:22 PM  Result Value Ref Range   D-Dimer, Quant >20.00 (H) 0.00 - 0.50 ug/mL-FEU    Comment: (NOTE) At the manufacturer cut-off value of 0.5 g/mL FEU, this assay has a negative predictive value of 95-100%.This assay is intended for use in conjunction with a clinical pretest probability (PTP) assessment model to exclude pulmonary embolism (PE) and deep venous thrombosis (DVT) in outpatients suspected of PE or DVT. Results should be correlated with clinical presentation. Performed at [facility], [address] [place], [place] [address]   Procalcitonin     Status: None   Collection Time: 07/04/23  8:22 PM  Result Value Ref Range   Procalcitonin <0.10 ng/mL    Comment:        Interpretation: PCT (Procalcitonin) <=  0.5 ng/mL: Systemic infection (sepsis) is not likely. Local bacterial infection is possible. (NOTE)       Sepsis PCT Algorithm           Lower Respiratory Tract                                      Infection PCT Algorithm    ----------------------------     ----------------------------         PCT < 0.25 ng/mL                PCT < 0.10 ng/mL          Strongly encourage             Strongly discourage   discontinuation of antibiotics    initiation of antibiotics    ----------------------------     -----------------------------       PCT 0.25 - 0.50 ng/mL            PCT 0.10 - 0.25 ng/mL               OR       >80% decrease in PCT            Discourage initiation of                                            antibiotics      Encourage discontinuation            of antibiotics    ----------------------------     -----------------------------         PCT >= 0.50 ng/mL              PCT 0.26 - 0.50 ng/mL               AND        <80% decrease in PCT             Encourage initiation of                                             antibiotics       Encourage continuation           of antibiotics    ----------------------------     -----------------------------        PCT >= 0.50 ng/mL                  PCT > 0.50 ng/mL               AND         increase in PCT                  Strongly encourage                                      initiation of antibiotics    Strongly encourage escalation           of antibiotics                                     -----------------------------  PCT <= 0.25 ng/mL                                                 OR                                        > 80% decrease in PCT                                      Discontinue / Do not initiate                                             antibiotics  Performed at [facility], [address] [place], [place] [address]   Resp panel by RT-PCR (RSV, Flu A&B, Covid) Anterior Nasal Swab     Status: Abnormal   Collection Time: 07/04/23  8:22 PM   Specimen: Anterior Nasal Swab  Result Value Ref Range   SARS Coronavirus 2 by RT PCR POSITIVE (A) NEGATIVE    Comment: (NOTE) SARS-CoV-2 target nucleic acids are DETECTED.  The SARS-CoV-2 RNA is generally detectable in upper respiratory specimens during the acute phase of infection. Positive results are indicative of the presence of the identified virus, but do not rule out bacterial infection or co-infection with other pathogens not detected by the test. Clinical correlation with patient history and other diagnostic information is necessary to determine patient infection status. The expected result is Negative.  Fact Sheet for  Patients: BloggerCourse.com  Fact Sheet for Healthcare Providers: SeriousBroker.it  This test is not yet approved or cleared by the [place] FDA and  has been authorized for detection and/or diagnosis of SARS-CoV-2 by FDA under an Emergency Use Authorization (EUA).  This EUA will remain in effect (meaning this test can be used) for the duration of  the COVID-19 declaration under Section 564(b)(1) of the A ct, 21 U.S.C. section 360bbb-3(b)(1), unless the authorization is terminated or revoked sooner.     Influenza A by PCR NEGATIVE NEGATIVE   Influenza B by PCR NEGATIVE NEGATIVE    Comment: (NOTE) The Xpert Xpress SARS-CoV-2/FLU/RSV plus assay is intended as an aid in the diagnosis of influenza from Nasopharyngeal swab specimens and should not be used as a sole basis for treatment. Nasal washings and aspirates are unacceptable for Xpert Xpress SARS-CoV-2/FLU/RSV testing.  Fact Sheet for Patients: BloggerCourse.com  Fact Sheet for Healthcare Providers: SeriousBroker.it  This test is not yet approved or cleared by the [place] FDA and has been authorized for detection and/or diagnosis of SARS-CoV-2 by FDA under an Emergency Use Authorization (EUA). This EUA will remain in effect (meaning this test can be used) for the duration of the COVID-19 declaration under Section 564(b)(1) of the Act, 21 U.S.C. section 360bbb-3(b)(1), unless the authorization is terminated or revoked.     Resp Syncytial Virus by PCR NEGATIVE NEGATIVE    Comment: (NOTE) Fact Sheet for Patients: BloggerCourse.com  Fact Sheet for Healthcare Providers: SeriousBroker.it  This test is not yet approved or cleared by the [place] FDA and has  been authorized for detection and/or diagnosis of SARS-CoV-2 by FDA under an Emergency Use Authorization  (EUA). This EUA will remain in effect (meaning this test can be used) for the duration of the COVID-19 declaration under Section 564(b)(1) of the Act, 21 U.S.C. section 360bbb-3(b)(1), unless the authorization is terminated or revoked.  Performed at [facility], [address] [address] [place], [place] [address]   Troponin I (High Sensitivity)     Status: Abnormal   Collection Time: 07/04/23  8:22 PM  Result Value Ref Range   Troponin I (High Sensitivity) 201 (HH) <18 ng/L    Comment: CRITICAL VALUE NOTED. VALUE IS CONSISTENT WITH PREVIOUSLY REPORTED/CALLED VALUE [facility] (NOTE) Elevated high sensitivity troponin I (hsTnI) values and significant  changes across serial measurements may suggest ACS but many other  chronic and acute conditions are known to elevate hsTnI results.  Refer to the "Links" section for chest pain algorithms and additional  guidance. Performed at [facility], [address] [place], [place] [address]   APTT     Status: None   Collection Time: 07/04/23  8:22 PM  Result Value Ref Range   aPTT 26 24 - 36 seconds    Comment: Performed at [facility], [address] [place], [place] [address]  Protime-INR     Status: None   Collection Time: 07/04/23  8:22 PM  Result Value Ref Range   Prothrombin Time 14.0 11.4 - 15.2 seconds   INR 1.1 0.8 - 1.2    Comment: (NOTE) INR goal varies based on device and disease states. Performed at [facility], [address] [place], [place] [address]   MRSA Next Gen by PCR, Nasal     Status: None   Collection Time: 07/05/23 12:24 AM   Specimen: Nasal Mucosa; Nasal Swab  Result Value Ref Range   MRSA by PCR Next Gen NOT DETECTED NOT DETECTED    Comment: (NOTE) The GeneXpert MRSA Assay (FDA approved for NASAL specimens only), is one component of a comprehensive MRSA colonization surveillance program. It is not intended to diagnose MRSA infection nor to guide or monitor treatment for MRSA  infections. Test performance is not FDA approved in patients less than 42 years old. Performed at [facility], [address], [place], [place] [address]   Glucose, capillary     Status: Abnormal   Collection Time: 07/05/23 12:32 AM  Result Value Ref Range   Glucose-Capillary 147 (H) 70 - 99 mg/dL    Comment: Glucose reference range applies only to samples taken after fasting for at least 8 hours.  APTT     Status: Abnormal   Collection Time: 07/05/23  1:25 AM  Result Value Ref Range   aPTT 50 (H) 24 - 36 seconds    Comment:        IF BASELINE aPTT IS ELEVATED, SUGGEST PATIENT RISK ASSESSMENT BE USED TO DETERMINE APPROPRIATE ANTICOAGULANT THERAPY. Performed at [facility], [address], [place], [place] [address]   Protime-INR     Status: Abnormal   Collection Time: 07/05/23  1:25 AM  Result Value Ref Range   Prothrombin Time 16.6 (H) 11.4 - 15.2 seconds   INR 1.3 (H) 0.8 - 1.2    Comment: (NOTE) INR goal varies based on device and disease states. Performed at [facility], [address], [place], [place] [address]   CBC     Status: Abnormal   Collection Time: 07/05/23  1:25 AM  Result Value Ref Range   WBC 14.9 (H) 4.0 - 10.5 K/uL   RBC 4.77 3.87 - 5.11 MIL/uL   Hemoglobin 13.1 12.0 - 15.0 g/dL   HCT 16.1 09.6 - 04.5 %   MCV 84.3 80.0 - 100.0 fL   MCH 27.5 26.0 - 34.0 pg   MCHC 32.6 30.0 - 36.0 g/dL   RDW 40.9 81.1 - 91.4 %   Platelets 114 (L) 150 - 400 K/uL    Comment: REPEATED TO VERIFY   nRBC 0.1 0.0 - 0.2 %    Comment: Performed at [facility], [address], [place], [place] [address]  Lactic acid, plasma     Status: Abnormal   Collection Time: 07/05/23  1:25 AM  Result Value Ref Range   Lactic Acid, Venous 4.3 (HH) 0.5 - 1.9 mmol/L    Comment: CRITICAL RESULT CALLED TO, READ BACK BY AND VERIFIED WITH EGNA, J. RN @ 0200 07/05/23 JBUTLER Performed at [facility], [address], [place], [place] [address]   HIV Antibody  (routine testing w rflx)     Status: None   Collection Time: 07/05/23  1:25 AM  Result Value Ref Range   HIV Screen 4th Generation wRfx Non Reactive Non Reactive    Comment: Performed at [facility], [address], [place], [place] [address]  Hemoglobin A1c     Status: Abnormal   Collection Time: 07/05/23  1:25 AM  Result Value Ref Range   Hgb A1c MFr Bld 5.9 (H) 4.8 - 5.6 %    Comment: (NOTE)         Prediabetes: 5.7 - 6.4         Diabetes: >6.4         Glycemic control for adults with diabetes: <7.0    Mean Plasma Glucose 123 mg/dL    Comment: (NOTE) Performed At: [facility] [address] [place], [place] [id number] Jolene Schimke MD UX:[id number]   Lactic acid, plasma     Status: Abnormal   Collection Time: 07/05/23  4:16 AM  Result Value Ref Range   Lactic Acid, Venous 3.5 (HH) 0.5 - 1.9 mmol/L    Comment: CRITICAL VALUE NOTED. VALUE IS CONSISTENT WITH PREVIOUSLY REPORTED/CALLED VALUE Performed at [facility], [address], [place], [place] [address]   Basic metabolic panel     Status: Abnormal   Collection Time: 07/05/23  4:16 AM  Result Value Ref Range   Sodium 136 135 - 145 mmol/L   Potassium 3.8 3.5 - 5.1 mmol/L   Chloride 102 98 - 111 mmol/L   CO2 19 (L) 22 - 32 mmol/L   Glucose, Bld 114 (H) 70 - 99 mg/dL    Comment: Glucose reference range applies only to samples taken after fasting for at least 8 hours.   BUN 28 (H) 8 - 23 mg/dL   Creatinine, Ser 4.03 (H) 0.44 - 1.00 mg/dL   Calcium 8.4 (L) 8.9 - 10.3 mg/dL   GFR, Estimated 45 (L) >60 mL/min    Comment: (NOTE) Calculated using the CKD-EPI Creatinine Equation (2021)    Anion gap 15 5 - 15    Comment: Performed at [facility], [address] [place], [place] [address]  Glucose, capillary     Status: Abnormal   Collection Time: 07/05/23  7:05 AM  Result Value Ref Range   Glucose-Capillary 129 (H) 70 - 99 mg/dL    Comment: Glucose reference range applies only  to samples taken  after fasting for at least 8 hours.  Heparin level (unfractionated)     Status: None   Collection Time: 07/05/23 10:25 AM  Result Value Ref Range   Heparin Unfractionated 0.53 0.30 - 0.70 IU/mL    Comment: (NOTE) The clinical reportable range upper limit is being lowered to >1.10 to align with the FDA approved guidance for the current laboratory assay.  If heparin results are below expected values, and patient dosage has  been confirmed, suggest follow up testing of antithrombin III levels. Performed at [facility], [address] [place], [place] [address]   Glucose, capillary     Status: Abnormal   Collection Time: 07/05/23 11:36 AM  Result Value Ref Range   Glucose-Capillary 143 (H) 70 - 99 mg/dL    Comment: Glucose reference range applies only to samples taken after fasting for at least 8 hours.  Glucose, capillary     Status: Abnormal   Collection Time: 07/05/23  3:06 PM  Result Value Ref Range   Glucose-Capillary 127 (H) 70 - 99 mg/dL    Comment: Glucose reference range applies only to samples taken after fasting for at least 8 hours.  Heparin level (unfractionated)     Status: None   Collection Time: 07/05/23  6:20 PM  Result Value Ref Range   Heparin Unfractionated 0.41 0.30 - 0.70 IU/mL    Comment: (NOTE) The clinical reportable range upper limit is being lowered to >1.10 to align with the FDA approved guidance for the current laboratory assay.  If heparin results are below expected values, and patient dosage has  been confirmed, suggest follow up testing of antithrombin III levels. Performed at [facility], [address], [place], [place] [address]   Glucose, capillary     Status: Abnormal   Collection Time: 07/05/23 10:09 PM  Result Value Ref Range   Glucose-Capillary 127 (H) 70 - 99 mg/dL    Comment: Glucose reference range applies only to samples taken after fasting for at least 8 hours.   Comment 1 Notify RN   Heparin level (unfractionated)      Status: None   Collection Time: 07/06/23  2:12 AM  Result Value Ref Range   Heparin Unfractionated 0.39 0.30 - 0.70 IU/mL    Comment: (NOTE) The clinical reportable range upper limit is being lowered to >1.10 to align with the FDA approved guidance for the current laboratory assay.  If heparin results are below expected values, and patient dosage has  been confirmed, suggest follow up testing of antithrombin III levels. Performed at [facility], [address] [place], [place] [address]   CBC     Status: Abnormal   Collection Time: 07/06/23  2:12 AM  Result Value Ref Range   WBC 13.4 (H) 4.0 - 10.5 K/uL   RBC 3.96 3.87 - 5.11 MIL/uL   Hemoglobin 10.8 (L) 12.0 - 15.0 g/dL   HCT 24.4 (L) 01.0 - 27.2 %   MCV 83.8 80.0 - 100.0 fL   MCH 27.3 26.0 - 34.0 pg   MCHC 32.5 30.0 - 36.0 g/dL   RDW 53.6 64.4 - 03.4 %   Platelets 153 150 - 400 K/uL   nRBC 0.0 0.0 - 0.2 %    Comment: Performed at [facility], [address], [place], [place] [address]  Glucose, capillary     Status: Abnormal   Collection Time: 07/06/23  6:21 AM  Result Value Ref Range  Glucose-Capillary 138 (H) 70 - 99 mg/dL    Comment: Glucose reference range applies only to samples taken after fasting for at least 8 hours.  C-reactive protein     Status: Abnormal   Collection Time: 07/06/23  8:50 AM  Result Value Ref Range   CRP 4.1 (H) <1.0 mg/dL    Comment: Performed at [facility], [address], [place], [place] [address]  Ferritin     Status: None   Collection Time: 07/06/23  8:50 AM  Result Value Ref Range   Ferritin 214 11 - 307 ng/mL    Comment: Performed at [facility], [address] [place], [place] [address]  Glucose, capillary     Status: Abnormal   Collection Time: 07/06/23 11:03 AM  Result Value Ref Range   Glucose-Capillary 109 (H) 70 - 99 mg/dL    Comment: Glucose reference range applies only to samples taken after fasting for at least 8 hours.  ECHOCARDIOGRAM  COMPLETE     Status: None   Collection Time: 07/06/23  2:40 PM  Result Value Ref Range   Weight 3,252.23 oz   Height 60 in   BP 153/86 mmHg   S' Lateral 1.50 cm   AR max vel 1.77 cm2   AV Peak grad 9.9 mmHg   Ao pk vel 1.57 m/s   Area-P 1/2 5.31 cm2   Est EF > 75%   Glucose, capillary     Status: None   Collection Time: 07/06/23  5:03 PM  Result Value Ref Range   Glucose-Capillary 87 70 - 99 mg/dL    Comment: Glucose reference range applies only to samples taken after fasting for at least 8 hours.  Glucose, capillary     Status: None   Collection Time: 07/06/23  8:44 PM  Result Value Ref Range   Glucose-Capillary 80 70 - 99 mg/dL    Comment: Glucose reference range applies only to samples taken after fasting for at least 8 hours.  Heparin level (unfractionated)     Status: None   Collection Time: 07/07/23  2:29 AM  Result Value Ref Range   Heparin Unfractionated 0.35 0.30 - 0.70 IU/mL    Comment: (NOTE) The clinical reportable range upper limit is being lowered to >1.10 to align with the FDA approved guidance for the current laboratory assay.  If heparin results are below expected values, and patient dosage has  been confirmed, suggest follow up testing of antithrombin III levels. Performed at [facility], [address], [place], [place] [address]   CBC     Status: Abnormal   Collection Time: 07/07/23  2:29 AM  Result Value Ref Range   WBC 14.2 (H) 4.0 - 10.5 K/uL   RBC 4.17 3.87 - 5.11 MIL/uL   Hemoglobin 11.4 (L) 12.0 - 15.0 g/dL   HCT 21.3 (L) 08.6 - 57.8 %   MCV 83.0 80.0 - 100.0 fL   MCH 27.3 26.0 - 34.0 pg   MCHC 32.9 30.0 - 36.0 g/dL   RDW 46.9 62.9 - 52.8 %   Platelets 187 150 - 400 K/uL   nRBC 0.2 0.0 - 0.2 %    Comment: Performed at [facility], [address] [place], [place] [address]  Basic metabolic panel     Status: Abnormal   Collection Time: 07/07/23  2:29 AM  Result Value Ref Range   Sodium 135 135 - 145 mmol/L   Potassium 4.0  3.5 - 5.1  mmol/L   Chloride 102 98 - 111 mmol/L   CO2 23 22 - 32 mmol/L   Glucose, Bld 125 (H) 70 - 99 mg/dL    Comment: Glucose reference range applies only to samples taken after fasting for at least 8 hours.   BUN 27 (H) 8 - 23 mg/dL   Creatinine, Ser 7.82 (H) 0.44 - 1.00 mg/dL   Calcium 8.7 (L) 8.9 - 10.3 mg/dL   GFR, Estimated 56 (L) >60 mL/min    Comment: (NOTE) Calculated using the CKD-EPI Creatinine Equation (2021)    Anion gap 10 5 - 15    Comment: Performed at [facility], [address], [place], [place] [address]  Glucose, capillary     Status: Abnormal   Collection Time: 07/07/23  6:12 AM  Result Value Ref Range   Glucose-Capillary 120 (H) 70 - 99 mg/dL    Comment: Glucose reference range applies only to samples taken after fasting for at least 8 hours.   Comment 1 Document in Chart   Lactic acid, plasma     Status: Abnormal   Collection Time: 07/07/23 10:47 AM  Result Value Ref Range   Lactic Acid, Venous 2.2 (HH) 0.5 - 1.9 mmol/L    Comment: CRITICAL VALUE NOTED. VALUE IS CONSISTENT WITH PREVIOUSLY REPORTED/CALLED VALUE Performed at [facility], [address] [place], [place] [address]   Glucose, capillary     Status: None   Collection Time: 07/07/23 11:42 AM  Result Value Ref Range   Glucose-Capillary 93 70 - 99 mg/dL    Comment: Glucose reference range applies only to samples taken after fasting for at least 8 hours.  POCT Activated clotting time     Status: None   Collection Time: 07/07/23  4:05 PM  Result Value Ref Range   Activated Clotting Time 106 seconds    Comment: Reference range 74-137 seconds for patients not on anticoagulant therapy.  POCT Activated clotting time     Status: None   Collection Time: 07/07/23  4:42 PM  Result Value Ref Range   Activated Clotting Time 204 seconds    Comment: Reference range 74-137 seconds for patients not on anticoagulant therapy.  Glucose, capillary     Status: None   Collection Time: 07/07/23  5:27  PM  Result Value Ref Range   Glucose-Capillary 76 70 - 99 mg/dL    Comment: Glucose reference range applies only to samples taken after fasting for at least 8 hours.  Glucose, capillary     Status: None   Collection Time: 07/07/23  8:01 PM  Result Value Ref Range   Glucose-Capillary 79 70 - 99 mg/dL    Comment: Glucose reference range applies only to samples taken after fasting for at least 8 hours.  Heparin level (unfractionated)     Status: None   Collection Time: 07/08/23  2:33 AM  Result Value Ref Range   Heparin Unfractionated 0.53 0.30 - 0.70 IU/mL    Comment: (NOTE) The clinical reportable range upper limit is being lowered to >1.10 to align with the FDA approved guidance for the current laboratory assay.  If heparin results are below expected values, and patient dosage has  been confirmed, suggest follow up testing of antithrombin III levels. Performed at [facility], [address], [place], [place] [address]   CBC     Status: Abnormal   Collection Time: 07/08/23  2:33 AM  Result Value Ref Range   WBC 13.2 (H) 4.0 -  10.5 K/uL   RBC 4.20 3.87 - 5.11 MIL/uL   Hemoglobin 11.6 (L) 12.0 - 15.0 g/dL   HCT 21.3 (L) 08.6 - 57.8 %   MCV 85.0 80.0 - 100.0 fL   MCH 27.6 26.0 - 34.0 pg   MCHC 32.5 30.0 - 36.0 g/dL   RDW 46.9 62.9 - 52.8 %   Platelets 181 150 - 400 K/uL   nRBC 0.0 0.0 - 0.2 %    Comment: Performed at [facility], [address] [place], [place] [address]  Glucose, capillary     Status: None   Collection Time: 07/08/23  7:55 AM  Result Value Ref Range   Glucose-Capillary 74 70 - 99 mg/dL    Comment: Glucose reference range applies only to samples taken after fasting for at least 8 hours.  Glucose, capillary     Status: None   Collection Time: 07/08/23 12:14 PM  Result Value Ref Range   Glucose-Capillary 78 70 - 99 mg/dL    Comment: Glucose reference range applies only to samples taken after fasting for at least 8 hours.  Glucose, capillary      Status: None   Collection Time: 07/08/23  4:04 PM  Result Value Ref Range   Glucose-Capillary 86 70 - 99 mg/dL    Comment: Glucose reference range applies only to samples taken after fasting for at least 8 hours.  Heparin level (unfractionated)     Status: Abnormal   Collection Time: 07/09/23  6:14 AM  Result Value Ref Range   Heparin Unfractionated 0.22 (L) 0.30 - 0.70 IU/mL    Comment: (NOTE) The clinical reportable range upper limit is being lowered to >1.10 to align with the FDA approved guidance for the current laboratory assay.  If heparin results are below expected values, and patient dosage has  been confirmed, suggest follow up testing of antithrombin III levels. Performed at [facility], [address], [place], [place] [address]   CBC     Status: Abnormal   Collection Time: 07/09/23  6:14 AM  Result Value Ref Range   WBC 11.3 (H) 4.0 - 10.5 K/uL   RBC 4.14 3.87 - 5.11 MIL/uL   Hemoglobin 11.5 (L) 12.0 - 15.0 g/dL   HCT 72.5 (L) 36.6 - 44.0 %   MCV 83.8 80.0 - 100.0 fL   MCH 27.8 26.0 - 34.0 pg   MCHC 33.1 30.0 - 36.0 g/dL   RDW 34.7 42.5 - 95.6 %   Platelets 188 150 - 400 K/uL   nRBC 0.4 (H) 0.0 - 0.2 %    Comment: Performed at [facility], [address], [place], [place] [address]  Basic metabolic panel     Status: Abnormal   Collection Time: 07/09/23  6:14 AM  Result Value Ref Range   Sodium 136 135 - 145 mmol/L   Potassium 3.7 3.5 - 5.1 mmol/L   Chloride 104 98 - 111 mmol/L   CO2 23 22 - 32 mmol/L   Glucose, Bld 79 70 - 99 mg/dL    Comment: Glucose reference range applies only to samples taken after fasting for at least 8 hours.   BUN 24 (H) 8 - 23 mg/dL   Creatinine, Ser 4.33 (H) 0.44 - 1.00 mg/dL   Calcium 8.7 (L) 8.9 - 10.3 mg/dL   GFR, Estimated 59 (L) >60 mL/min    Comment: (NOTE) Calculated using the CKD-EPI Creatinine Equation (2021)    Anion gap 9 5 - 15  Comment: Performed at [facility], [address],  [place], [place] [address]  Glucose, capillary     Status: Abnormal   Collection Time: 07/09/23  9:05 AM  Result Value Ref Range   Glucose-Capillary 108 (H) 70 - 99 mg/dL    Comment: Glucose reference range applies only to samples taken after fasting for at least 8 hours.   Comment 1 Document in Chart   Glucose, capillary     Status: Abnormal   Collection Time: 07/09/23 12:16 PM  Result Value Ref Range   Glucose-Capillary 121 (H) 70 - 99 mg/dL    Comment: Glucose reference range applies only to samples taken after fasting for at least 8 hours.   Comment 1 Document in Chart   Glucose, capillary     Status: Abnormal   Collection Time: 07/09/23  5:02 PM  Result Value Ref Range   Glucose-Capillary 122 (H) 70 - 99 mg/dL    Comment: Glucose reference range applies only to samples taken after fasting for at least 8 hours.   Comment 1 Document in Chart   Heparin level (unfractionated)     Status: Abnormal   Collection Time: 07/09/23  5:26 PM  Result Value Ref Range   Heparin Unfractionated 0.29 (L) 0.30 - 0.70 IU/mL    Comment: (NOTE) The clinical reportable range upper limit is being lowered to >1.10 to align with the FDA approved guidance for the current laboratory assay.  If heparin results are below expected values, and patient dosage has  been confirmed, suggest follow up testing of antithrombin III levels. Performed at [facility], [address], [place], [place] [address]   Glucose, capillary     Status: Abnormal   Collection Time: 07/09/23  9:16 PM  Result Value Ref Range   Glucose-Capillary 105 (H) 70 - 99 mg/dL    Comment: Glucose reference range applies only to samples taken after fasting for at least 8 hours.  Basic metabolic panel     Status: Abnormal   Collection Time: 07/10/23  7:55 AM  Result Value Ref Range   Sodium 137 135 - 145 mmol/L   Potassium 4.3 3.5 - 5.1 mmol/L   Chloride 103 98 - 111 mmol/L   CO2 22 22 - 32 mmol/L   Glucose, Bld 106 (H) 70 - 99 mg/dL     Comment: Glucose reference range applies only to samples taken after fasting for at least 8 hours.   BUN 20 8 - 23 mg/dL   Creatinine, Ser 0.98 (H) 0.44 - 1.00 mg/dL   Calcium 9.4 8.9 - 11.9 mg/dL   GFR, Estimated 56 (L) >60 mL/min    Comment: (NOTE) Calculated using the CKD-EPI Creatinine Equation (2021)    Anion gap 12 5 - 15    Comment: Performed at [facility], [address] [place], [place] [address]  Magnesium     Status: None   Collection Time: 07/10/23  7:55 AM  Result Value Ref Range   Magnesium 2.2 1.7 - 2.4 mg/dL    Comment: Performed at [facility], [address] [place], [place] [address]    Assessment/Plan:  Acute massive pulmonary embolism (HCC) Status post mechanical thrombectomy and another institution a few weeks ago.  Would continue full dose anticoagulation for 1 year and then likely reduced to a prophylactic dose of Eliquis going forward.  DVT (deep venous thrombosis) (HCC) The patient has fairly extensive left lower extremity DVT but minimal left leg  symptoms.  Her DVT is now 18 to 5 weeks old and particular with minimal symptoms, I would not recommend any intervention.  I would recommend 1 year of full dose anticoagulation and then likely reduced to a half dose of Eliquis going forward for prophylaxis.  I will plan to see her back in about 6 months and we will perform a left venous duplex to evaluate her iliofemoral system to evaluate for chronic stenosis.  No intervention planned at this time.  I have recommended she get compression socks and wear these daily.  I recommended exercise and elevation.  Hypertension blood pressure control important in reducing the progression of atherosclerotic disease. On appropriate oral medications.      Festus Barren 07/31/2023, 11:17 AM   This note was created with Dragon medical transcription system.  Any errors from dictation are unintentional.

## 2023-08-17 ENCOUNTER — Other Ambulatory Visit (HOSPITAL_COMMUNITY): Payer: Self-pay

## 2023-08-28 IMAGING — MG MM DIGITAL SCREENING BILAT W/ TOMO AND CAD
8 of 14 series · 8 of 40 positions shown · non-contrast
Comparison: Previous exam(s).

CLINICAL DATA: Screening.

EXAM:
DIGITAL SCREENING BILATERAL MAMMOGRAM WITH TOMOSYNTHESIS AND CAD
TECHNIQUE: Bilateral screening digital craniocaudal and mediolateral oblique
mammograms were obtained. Bilateral screening digital breast
tomosynthesis was performed. The images were evaluated with
computer-aided detection.

[L CC synth-2D (1 of 2)]
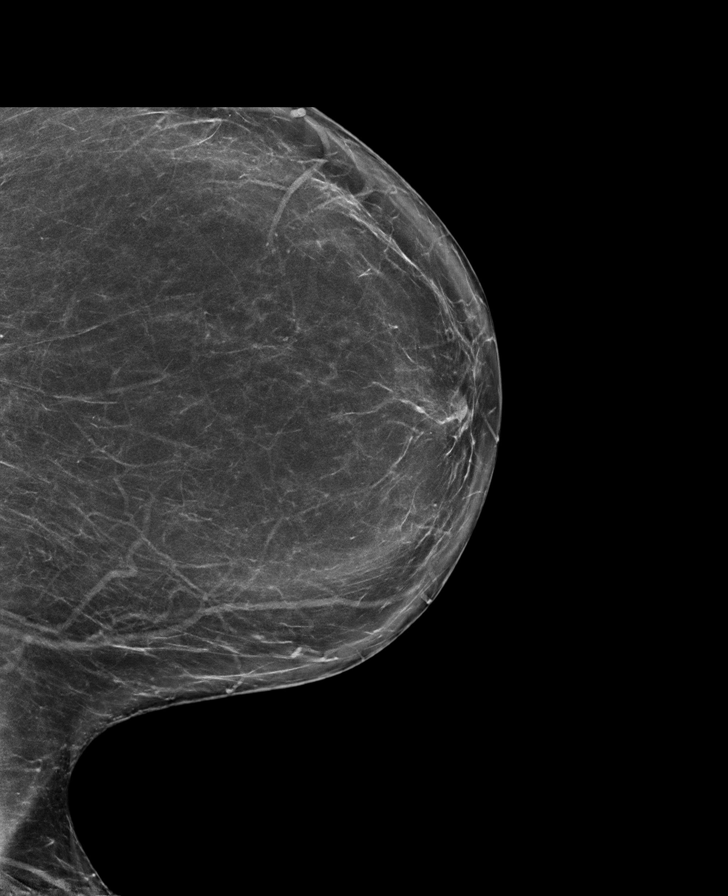

[L CC synth-2D (2 of 2)]
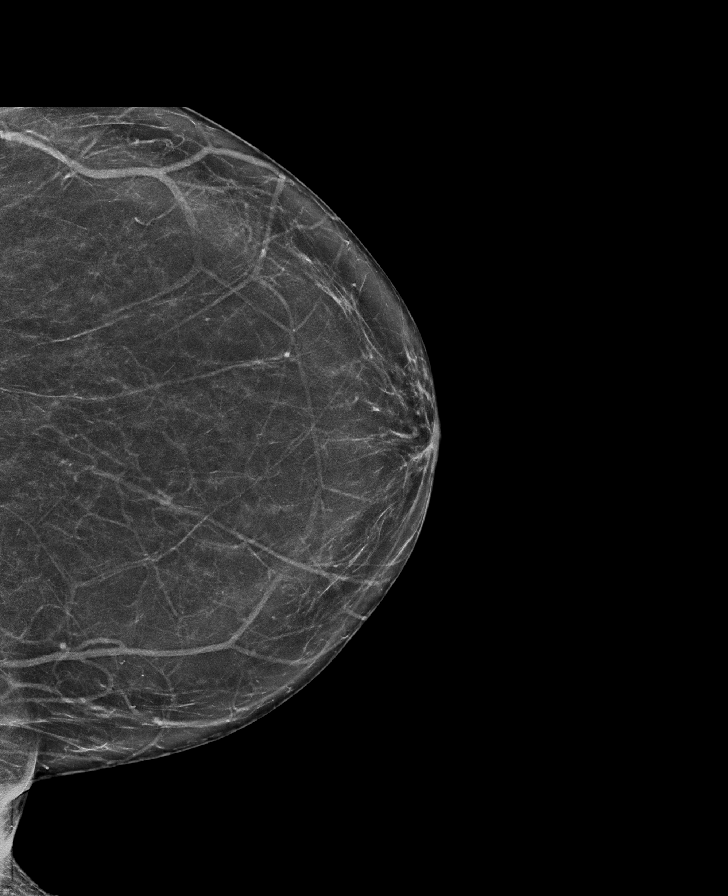

[R MLO synth-2D]
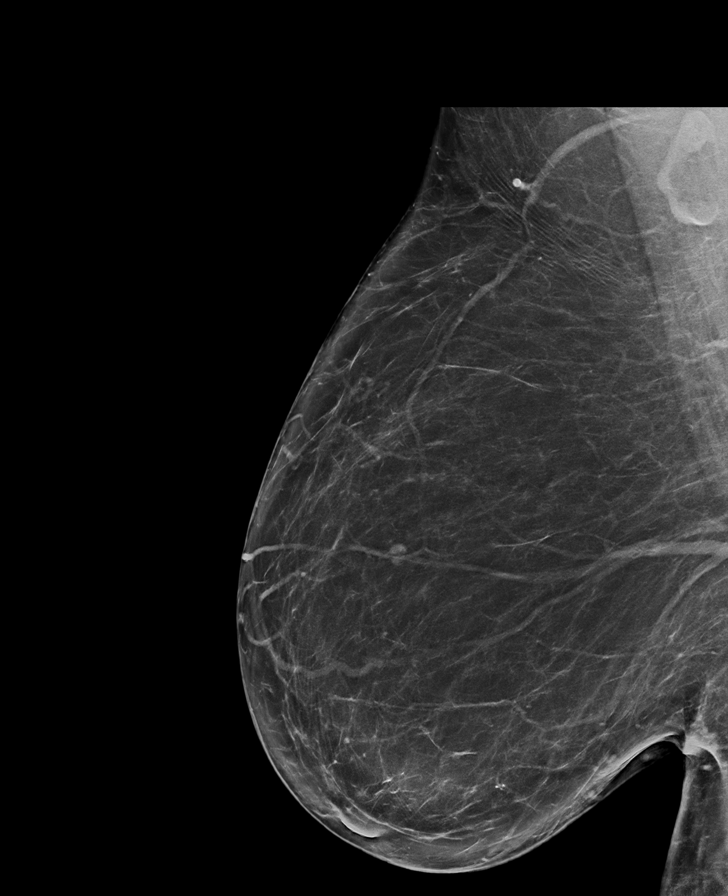

[L MLO synth-2D]
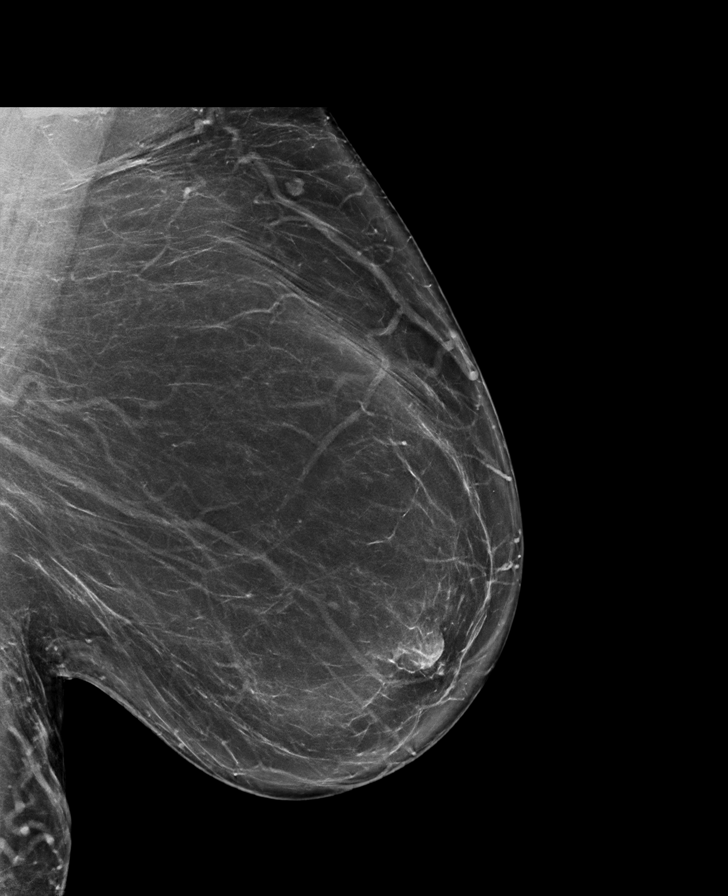

[R CC synth-2D (1 of 3)]
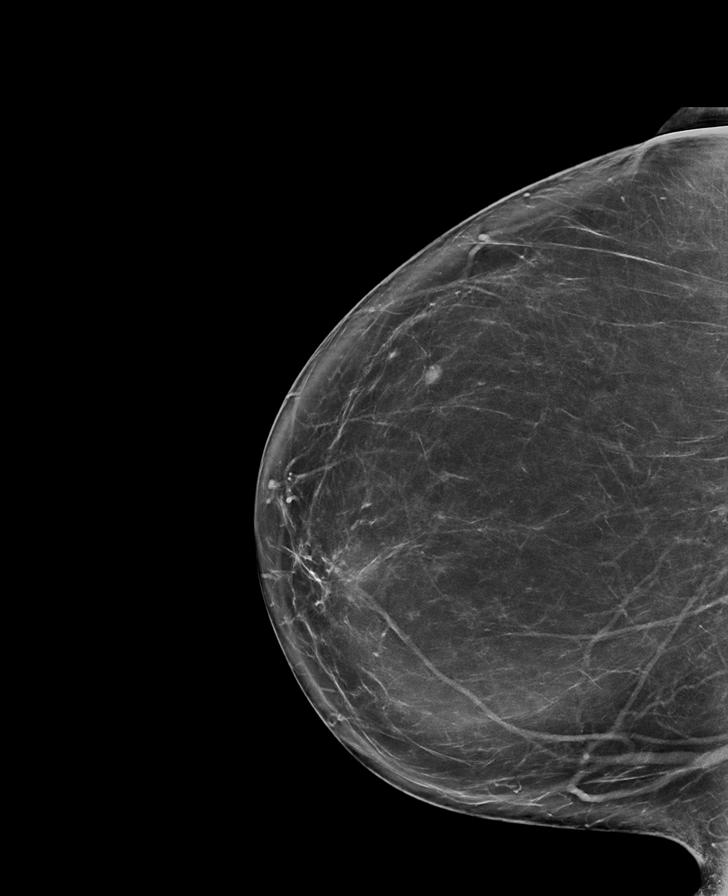

[R CC synth-2D (2 of 3)]
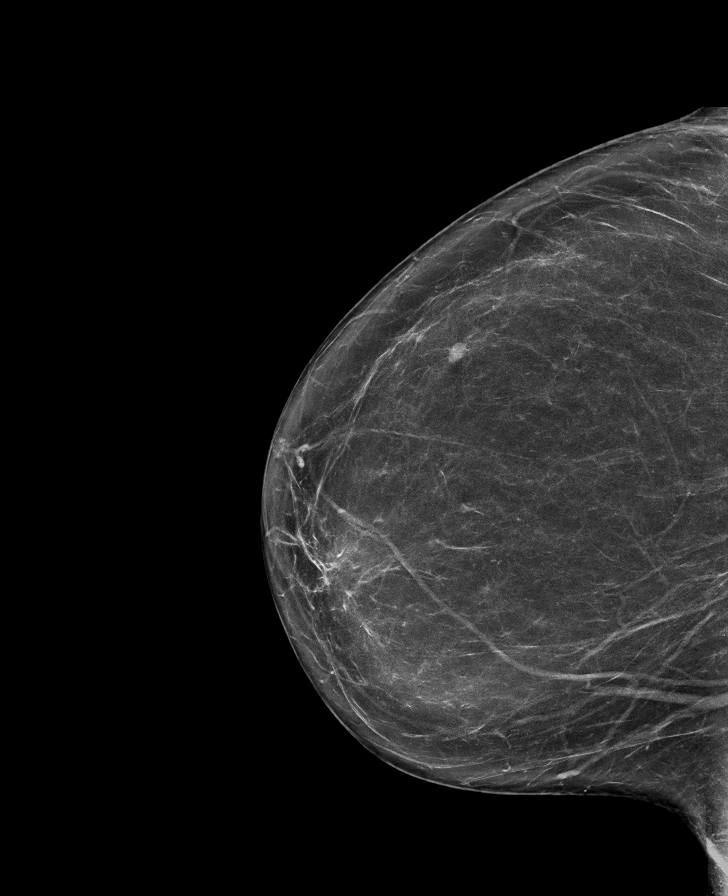

[R CC synth-2D (3 of 3)]
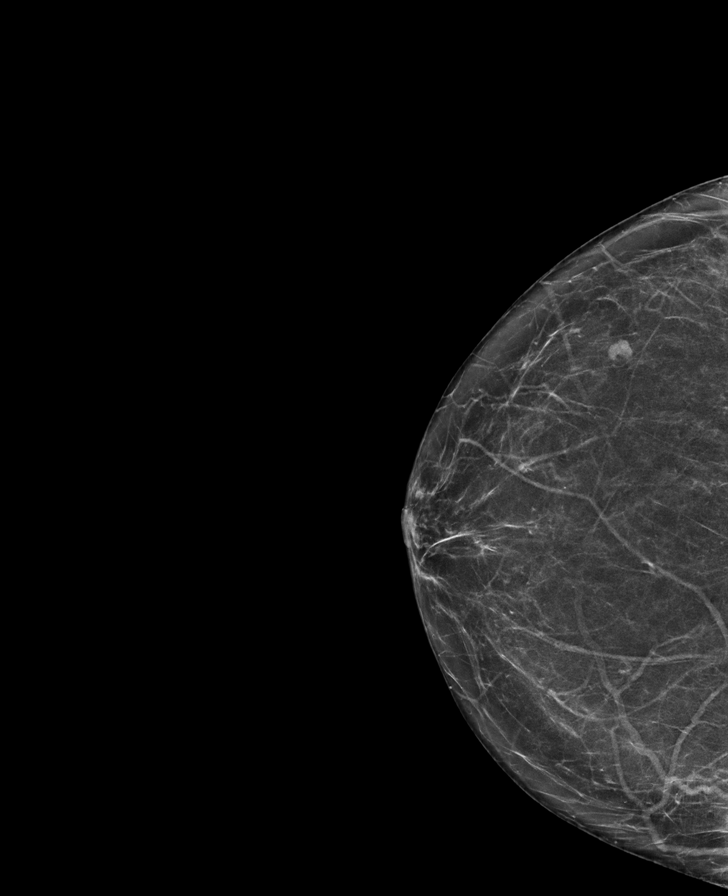

[R CC tomo · tomo slice 41/82.0]
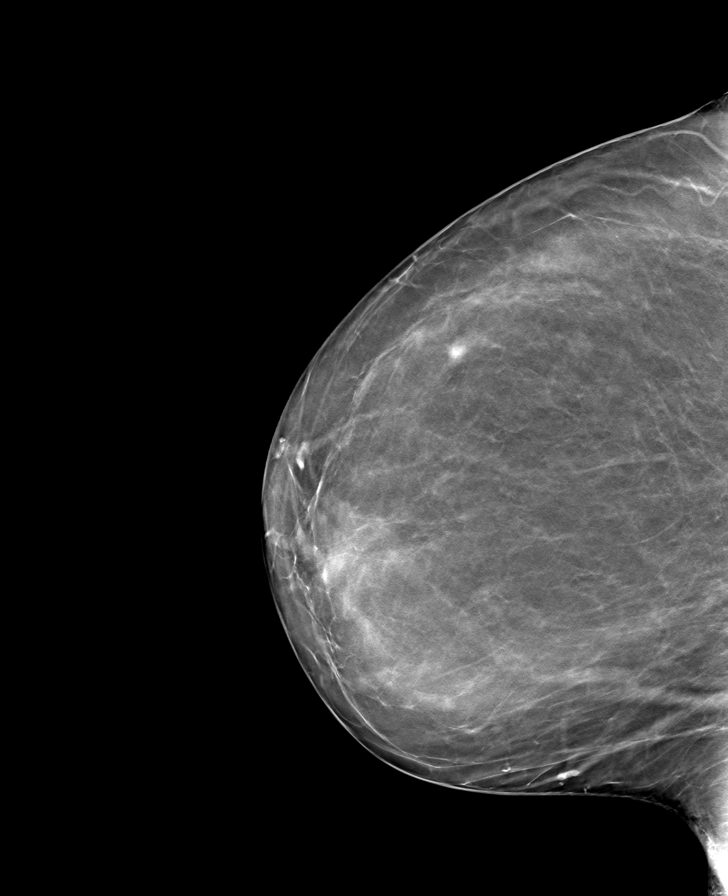

[8 of 40 positions shown; findings below may reference images not displayed]

ACR Breast Density Category b: There are scattered areas of
fibroglandular density.
FINDINGS: There are no findings suspicious for malignancy.
IMPRESSION: No mammographic evidence of malignancy. A result letter of this
screening mammogram will be mailed directly to the patient.

RECOMMENDATION:
Screening mammogram in one year. (Code:51-O-LD2)

BI-RADS CATEGORY  1: Negative.

## 2023-09-11 ENCOUNTER — Other Ambulatory Visit: Payer: Managed Care, Other (non HMO)

## 2023-09-14 ENCOUNTER — Other Ambulatory Visit (HOSPITAL_COMMUNITY): Payer: Self-pay

## 2023-09-21 ENCOUNTER — Ambulatory Visit: Payer: Managed Care, Other (non HMO) | Attending: Cardiovascular Disease

## 2023-09-21 DIAGNOSIS — R0602 Shortness of breath: Secondary | ICD-10-CM | POA: Diagnosis not present

## 2023-09-21 DIAGNOSIS — I519 Heart disease, unspecified: Secondary | ICD-10-CM | POA: Diagnosis not present

## 2023-09-21 LAB — ECHOCARDIOGRAM COMPLETE
AR max vel: 1.96 cm2
AV Area VTI: 2.09 cm2
AV Area mean vel: 1.96 cm2
AV Mean grad: 4 mmHg
AV Peak grad: 8 mmHg
Ao pk vel: 1.41 m/s
Area-P 1/2: 4.39 cm2
Calc EF: 57.2 %
S' Lateral: 3 cm
Single Plane A2C EF: 53.5 %
Single Plane A4C EF: 61 %

## 2023-10-07 ENCOUNTER — Other Ambulatory Visit (HOSPITAL_COMMUNITY): Payer: Self-pay

## 2023-10-07 MED ORDER — WEGOVY 2.4 MG/0.75ML ~~LOC~~ SOAJ
2.4000 mg | SUBCUTANEOUS | 2 refills | Status: DC
Start: 2023-10-07 — End: 2023-12-30
  Filled 2023-10-07: qty 3, 28d supply, fill #0
  Filled 2023-11-10: qty 3, 28d supply, fill #1
  Filled 2023-12-12 (×2): qty 3, 28d supply, fill #2

## 2023-10-08 ENCOUNTER — Other Ambulatory Visit: Payer: Self-pay

## 2023-10-20 ENCOUNTER — Ambulatory Visit
Admission: RE | Admit: 2023-10-20 | Discharge: 2023-10-20 | Disposition: A | Discharge status: Home / Self Care | Payer: Managed Care, Other (non HMO) | Source: Ambulatory Visit | Attending: Nurse Practitioner | Admitting: Nurse Practitioner

## 2023-10-20 DIAGNOSIS — Z780 Asymptomatic menopausal state: Secondary | ICD-10-CM

## 2023-10-26 ENCOUNTER — Other Ambulatory Visit (HOSPITAL_COMMUNITY): Payer: Self-pay

## 2023-10-30 ENCOUNTER — Other Ambulatory Visit (HOSPITAL_COMMUNITY): Payer: Self-pay

## 2023-11-03 ENCOUNTER — Telehealth: Payer: Self-pay

## 2023-11-03 NOTE — Telephone Encounter (Signed)
 Patient called and left a message - she is on blood thinners now and wanted to know if she could still get an injection in her foot. I called back and left a message - yes she can still have an injection, please call and schedule an appointment if needed.

## 2023-11-11 ENCOUNTER — Other Ambulatory Visit (HOSPITAL_COMMUNITY): Payer: Self-pay

## 2023-11-17 ENCOUNTER — Encounter (INDEPENDENT_AMBULATORY_CARE_PROVIDER_SITE_OTHER): Payer: Self-pay

## 2023-12-12 ENCOUNTER — Other Ambulatory Visit (HOSPITAL_COMMUNITY): Payer: Self-pay

## 2023-12-28 ENCOUNTER — Other Ambulatory Visit (HOSPITAL_COMMUNITY): Payer: Self-pay

## 2023-12-29 ENCOUNTER — Other Ambulatory Visit (HOSPITAL_COMMUNITY): Payer: Self-pay

## 2023-12-29 MED ORDER — TRIAMTERENE-HCTZ 37.5-25 MG PO TABS
1.0000 | ORAL_TABLET | Freq: Every morning | ORAL | 5 refills | Status: AC
  Filled 2023-12-29: qty 30, 30d supply, fill #0

## 2023-12-29 MED ORDER — FLUTICASONE PROPIONATE 50 MCG/ACT NA SUSP
1.0000 | Freq: Every day | NASAL | 5 refills | Status: AC
  Filled 2023-12-29: qty 16, 60d supply, fill #0
  Filled 2024-03-31: qty 16, 60d supply, fill #1
  Filled 2024-07-20: qty 16, 60d supply, fill #2

## 2023-12-29 MED ORDER — LISINOPRIL 40 MG PO TABS
40.0000 mg | ORAL_TABLET | Freq: Every morning | ORAL | 3 refills | Status: AC
  Filled 2023-12-29: qty 90, 90d supply, fill #0

## 2023-12-30 ENCOUNTER — Encounter (HOSPITAL_COMMUNITY): Payer: Self-pay

## 2023-12-30 ENCOUNTER — Other Ambulatory Visit (HOSPITAL_COMMUNITY): Payer: Self-pay

## 2023-12-30 MED ORDER — WEGOVY 2.4 MG/0.75ML ~~LOC~~ SOAJ
2.4000 mg | SUBCUTANEOUS | 1 refills | Status: AC
  Filled 2024-01-02: qty 3, 28d supply, fill #0
  Filled 2024-02-01: qty 3, 28d supply, fill #1

## 2024-01-02 ENCOUNTER — Other Ambulatory Visit (HOSPITAL_COMMUNITY): Payer: Self-pay

## 2024-01-04 ENCOUNTER — Other Ambulatory Visit (HOSPITAL_COMMUNITY): Payer: Self-pay

## 2024-01-05 ENCOUNTER — Other Ambulatory Visit (HOSPITAL_COMMUNITY): Payer: Self-pay

## 2024-01-26 ENCOUNTER — Other Ambulatory Visit (INDEPENDENT_AMBULATORY_CARE_PROVIDER_SITE_OTHER): Payer: Self-pay | Admitting: Vascular Surgery

## 2024-01-26 DIAGNOSIS — I82422 Acute embolism and thrombosis of left iliac vein: Secondary | ICD-10-CM

## 2024-01-26 DIAGNOSIS — I2699 Other pulmonary embolism without acute cor pulmonale: Secondary | ICD-10-CM

## 2024-01-26 DIAGNOSIS — I10 Essential (primary) hypertension: Secondary | ICD-10-CM

## 2024-01-29 ENCOUNTER — Ambulatory Visit (INDEPENDENT_AMBULATORY_CARE_PROVIDER_SITE_OTHER): Payer: Managed Care, Other (non HMO)

## 2024-01-29 ENCOUNTER — Encounter (INDEPENDENT_AMBULATORY_CARE_PROVIDER_SITE_OTHER): Payer: Self-pay | Admitting: Vascular Surgery

## 2024-01-29 ENCOUNTER — Encounter (INDEPENDENT_AMBULATORY_CARE_PROVIDER_SITE_OTHER)

## 2024-01-29 ENCOUNTER — Ambulatory Visit (INDEPENDENT_AMBULATORY_CARE_PROVIDER_SITE_OTHER): Payer: Managed Care, Other (non HMO) | Admitting: Vascular Surgery

## 2024-01-29 VITALS — BP 169/99 | HR 61 | Resp 18 | Wt 203.0 lb

## 2024-01-29 DIAGNOSIS — I82422 Acute embolism and thrombosis of left iliac vein: Secondary | ICD-10-CM

## 2024-01-29 DIAGNOSIS — I2699 Other pulmonary embolism without acute cor pulmonale: Secondary | ICD-10-CM

## 2024-01-29 DIAGNOSIS — I10 Essential (primary) hypertension: Secondary | ICD-10-CM

## 2024-01-29 NOTE — Progress Notes (Signed)
 Subjective:    Patient ID: Dana Donovan, female    DOB: 07-Oct-1960, 63 y.o.   MRN: [id number] Chief Complaint  Patient presents with   Follow-up    6 month left dvt follow up    Rosaly Labarbera is a 63 y.o. adult. She returns to clinic for evaluation of left lower extremity DVT and pulmonary embolus.  About 3 months ago, she was admitted to main campus [facility] where she was found to have extensive left lower extremity DVT and a saddle pulmonary embolus.  She actually had what sounds like a combination of systemic thrombolytic therapy followed by catheter directed mechanical thrombectomy performed by radiology at that institution.  Despite having what sounds like a fairly extensive left lower extremity DVT, her symptoms were fairly mild and no intervention was required at that time.  She has some mild heaviness and tiredness in her left leg in the evening with minimal swelling present.  She is tolerating anticoagulation without any major issues. She has been resuming all her normal activities over the past few months.      Review of Systems  Constitutional: Negative.   All other systems reviewed and are negative.      Objective:   Physical Exam Constitutional:      Appearance: Normal appearance. She is obese.  HENT:     Head: Normocephalic.  Eyes:     Pupils: Pupils are equal, round, and reactive to light.  Cardiovascular:     Rate and Rhythm: Normal rate and regular rhythm.     Pulses: Normal pulses.     Heart sounds: Normal heart sounds.  Pulmonary:     Effort: Pulmonary effort is normal.     Breath sounds: Normal breath sounds.  Abdominal:     General: Bowel sounds are normal.     Palpations: Abdomen is soft.  Musculoskeletal:     Right lower leg: Edema present.     Left lower leg: Edema present.  Skin:    General: Skin is warm and dry.     Capillary Refill: Capillary refill takes 2 to 3 seconds.  Neurological:     General: No focal deficit  present.     Mental Status: She is alert and oriented to person, place, and time. Mental status is at baseline.  Psychiatric:        Mood and Affect: Mood normal.        Behavior: Behavior normal.        Thought Content: Thought content normal.        Judgment: Judgment normal.     BP (!) 169/99   Pulse 61   Resp 18   Wt 203 lb (92.1 kg)   BMI 39.65 kg/m   Past Medical History:  Diagnosis Date   History of pulmonary embolism    Hypertension     Social History   Socioeconomic History   Marital status: Married    Spouse name: Not on file   Number of children: Not on file   Years of education: Not on file   Highest education level: Not on file  Occupational History   Not on file  Tobacco Use   Smoking status: Never   Smokeless tobacco: Never  Vaping Use   Vaping status: Never Used  Substance and Sexual Activity   Alcohol use: Never   Drug use: Never   Sexual activity: Not on file  Other Topics Concern   Not on file  Social History Narrative  Not on file   Social Drivers of Health   Financial Resource Strain: Not on file  Food Insecurity: No Food Insecurity (07/06/2023)   Hunger Vital Sign    Worried About Running Out of Food in the Last Year: Never true    Ran Out of Food in the Last Year: Never true  Transportation Needs: No Transportation Needs (07/06/2023)   PRAPARE - Administrator, Civil Service (Medical): No    Lack of Transportation (Non-Medical): No  Physical Activity: Not on file  Stress: Not on file  Social Connections: Not on file  Intimate Partner Violence: Not At Risk (07/06/2023)   Humiliation, Afraid, Rape, and Kick questionnaire    Fear of Current or Ex-Partner: No    Emotionally Abused: No    Physically Abused: No    Sexually Abused: No    Past Surgical History:  Procedure Laterality Date   IR ANGIOGRAM PULMONARY BILATERAL SELECTIVE  07/07/2023   IR ANGIOGRAM SELECTIVE EACH ADDITIONAL VESSEL  07/07/2023   IR ANGIOGRAM SELECTIVE  EACH ADDITIONAL VESSEL  07/07/2023   IR THROMBECT PRIM MECH INIT (INCLU) MOD SED  07/07/2023   IR THROMBECT PRIM MECH INIT (INCLU) MOD SED  07/07/2023   IR US  GUIDE VASC ACCESS RIGHT  07/07/2023    Family History  Problem Relation Age of Onset   Heart disease Maternal Grandmother    Breast cancer Neg Hx     No Known Allergies     Latest Ref Rng & Units 07/09/2023    6:14 AM 07/08/2023    2:33 AM 07/07/2023    2:29 AM  CBC  WBC 4.0 - 10.5 K/uL 11.3  13.2  14.2   Hemoglobin 12.0 - 15.0 g/dL 88.4  88.3  88.5   Hematocrit 36.0 - 46.0 % 34.7  35.7  34.6   Platelets 150 - 400 K/uL 188  181  187       CMP     Component Value Date/Time   NA 137 07/10/2023 0755   K 4.3 07/10/2023 0755   CL 103 07/10/2023 0755   CO2 22 07/10/2023 0755   GLUCOSE 106 (H) 07/10/2023 0755   BUN 20 07/10/2023 0755   CREATININE 1.10 (H) 07/10/2023 0755   CALCIUM 9.4 07/10/2023 0755   PROT 7.6 07/04/2023 2022   ALBUMIN 3.6 07/04/2023 2022   AST 29 07/04/2023 2022   ALT 24 07/04/2023 2022   ALKPHOS 78 07/04/2023 2022   BILITOT 0.6 07/04/2023 2022   GFRNONAA 56 (L) 07/10/2023 0755     No results found.     Assessment & Plan:   1. Acute deep vein thrombosis (DVT) of iliac vein of left lower extremity (HCC) (Primary) The patient has fairly extensive left lower extremity DVT but minimal left leg symptoms. Her DVT is now 3 to 82 months old and particular with minimal symptoms, I would not recommend any intervention. I would recommend 1 year of full dose anticoagulation and then likely reduced to a half dose of Eliquis  going forward for prophylaxis. I will plan to see her back in about 6 months and we will perform a left venous duplex to evaluate her iliofemoral system to evaluate for chronic stenosis. No intervention planned at this time. I have recommended she keep wearing her compression socks and wear these daily. I recommended exercise and elevation.   2. Acute massive pulmonary embolism (HCC) Status post  mechanical thrombectomy and another institution 3-4 months ago. Would continue full dose anticoagulation for 1  year and then likely reduced to a prophylactic dose of Eliquis  going forward.   3. Primary hypertension Continue antihypertensive medications as already ordered, these medications have been reviewed and there are no changes at this time.   Current Outpatient Medications on File Prior to Visit  Medication Sig Dispense Refill   albuterol  (PROVENTIL ) (2.5 MG/3ML) 0.083% nebulizer solution Take 2.5 mg by nebulization every 6 (six) hours as needed for shortness of breath or wheezing.     albuterol  (VENTOLIN  HFA) 108 (90 Base) MCG/ACT inhaler Inhale 2 puffs into the lungs every 6 (six) hours as needed for wheezing or shortness of breath.     APIXABAN  (ELIQUIS ) VTE STARTER PACK (10MG  AND 5MG ) Take apixaban /Eliquis  10 mg (two tabs) twice daily for 7 days, then take apixaban /Eliquis  5 mg (one tab) twice daily 74 tablet 0   diclofenac (VOLTAREN) 75 MG EC tablet Take 75 mg by mouth 2 (two) times daily as needed for mild pain (pain score 1-3).     DRY EYE RELIEF DROPS 0.2-0.2-1 % SOLN Place 1 drop into both eyes in the morning, at noon, and at bedtime.     fluticasone  (FLONASE ) 50 MCG/ACT nasal spray Place 2 sprays into both nostrils daily. (Patient taking differently: Place 2 sprays into both nostrils daily as needed for allergies or rhinitis.) 16 g 0   fluticasone  (FLONASE ) 50 MCG/ACT nasal spray Place 1 spray into both nostrils daily. 48 g 5   latanoprost (XALATAN) 0.005 % ophthalmic solution Place 1 drop into both eyes at bedtime.     lisinopril  (PRINIVIL ,ZESTRIL ) 40 MG tablet Take 40 mg by mouth daily.     lisinopril  (ZESTRIL ) 40 MG tablet Take 1 tablet (40 mg total) by mouth every morning. 90 tablet 3   oxybutynin  (DITROPAN -XL) 10 MG 24 hr tablet Take 10 mg by mouth at bedtime.     PRESCRIPTION MEDICATION See admin instructions. CPAP- At bedtime     Semaglutide -Weight Management (WEGOVY ) 2.4  MG/0.75ML SOAJ Inject 2.4 mg into the skin once a week. 3 mL 1   triamterene -hydrochlorothiazide  (MAXZIDE ) 75-50 MG tablet Take 1 tablet by mouth daily.     triamterene -hydrochlorothiazide  (MAXZIDE -25) 37.5-25 MG tablet Take 1 tablet by mouth every morning. 90 tablet 5   TYLENOL  500 MG tablet Take 500-1,000 mg by mouth every 6 (six) hours as needed for mild pain (pain score 1-3), headache or fever.     No current facility-administered medications on file prior to visit.    There are no Patient Instructions on file for this visit. No follow-ups on file.   Gwendlyn JONELLE Shank, NP

## 2024-03-03 ENCOUNTER — Other Ambulatory Visit (HOSPITAL_COMMUNITY): Payer: Self-pay

## 2024-03-03 MED ORDER — ZEPBOUND 5 MG/0.5ML ~~LOC~~ SOAJ
5.0000 mg | SUBCUTANEOUS | 3 refills | Status: AC
  Filled 2024-03-03 – 2024-03-07 (×2): qty 2, 28d supply, fill #0
  Filled 2024-03-31: qty 2, 28d supply, fill #1
  Filled 2024-04-27: qty 2, 28d supply, fill #2

## 2024-03-04 ENCOUNTER — Other Ambulatory Visit (HOSPITAL_COMMUNITY): Payer: Self-pay

## 2024-03-07 ENCOUNTER — Other Ambulatory Visit (HOSPITAL_COMMUNITY): Payer: Self-pay

## 2024-03-08 ENCOUNTER — Other Ambulatory Visit (HOSPITAL_COMMUNITY): Payer: Self-pay

## 2024-03-09 ENCOUNTER — Other Ambulatory Visit (HOSPITAL_COMMUNITY): Payer: Self-pay

## 2024-03-17 ENCOUNTER — Other Ambulatory Visit: Payer: Self-pay | Admitting: Internal Medicine

## 2024-03-17 DIAGNOSIS — Z1231 Encounter for screening mammogram for malignant neoplasm of breast: Secondary | ICD-10-CM

## 2024-03-30 ENCOUNTER — Other Ambulatory Visit (HOSPITAL_COMMUNITY): Payer: Self-pay

## 2024-03-30 MED ORDER — TRIAMTERENE-HCTZ 75-50 MG PO TABS
1.0000 | ORAL_TABLET | Freq: Every morning | ORAL | 1 refills | Status: DC
  Filled 2024-03-30: qty 30, 30d supply, fill #0

## 2024-03-30 MED ORDER — LISINOPRIL 40 MG PO TABS
40.0000 mg | ORAL_TABLET | Freq: Every day | ORAL | 5 refills | Status: DC
  Filled 2024-03-30: qty 30, 30d supply, fill #0

## 2024-03-30 MED ORDER — OXYBUTYNIN CHLORIDE ER 10 MG PO TB24
10.0000 mg | ORAL_TABLET | Freq: Two times a day (BID) | ORAL | 5 refills | Status: AC
  Filled 2024-03-30: qty 60, 30d supply, fill #0

## 2024-03-31 ENCOUNTER — Other Ambulatory Visit (HOSPITAL_COMMUNITY): Payer: Self-pay

## 2024-04-01 ENCOUNTER — Ambulatory Visit
Admission: RE | Admit: 2024-04-01 | Discharge: 2024-04-01 | Disposition: A | Discharge status: Home / Self Care | Source: Ambulatory Visit | Attending: Internal Medicine | Admitting: Internal Medicine

## 2024-04-01 DIAGNOSIS — Z1231 Encounter for screening mammogram for malignant neoplasm of breast: Secondary | ICD-10-CM

## 2024-04-14 ENCOUNTER — Other Ambulatory Visit: Payer: Self-pay

## 2024-04-14 ENCOUNTER — Other Ambulatory Visit (HOSPITAL_COMMUNITY): Payer: Self-pay

## 2024-04-14 MED ORDER — ZEPBOUND 7.5 MG/0.5ML ~~LOC~~ SOAJ
7.5000 mg | SUBCUTANEOUS | 1 refills | Status: DC
  Filled 2024-04-14 – 2024-05-02 (×2): qty 2, 28d supply, fill #0
  Filled 2024-05-24 (×2): qty 2, 28d supply, fill #1

## 2024-04-24 ENCOUNTER — Other Ambulatory Visit (HOSPITAL_COMMUNITY): Payer: Self-pay

## 2024-05-02 ENCOUNTER — Other Ambulatory Visit (HOSPITAL_COMMUNITY): Payer: Self-pay

## 2024-05-03 ENCOUNTER — Other Ambulatory Visit (HOSPITAL_COMMUNITY): Payer: Self-pay

## 2024-05-03 ENCOUNTER — Other Ambulatory Visit: Payer: Self-pay

## 2024-05-24 ENCOUNTER — Other Ambulatory Visit (HOSPITAL_COMMUNITY): Payer: Self-pay

## 2024-05-25 ENCOUNTER — Other Ambulatory Visit: Payer: Self-pay

## 2024-06-13 ENCOUNTER — Other Ambulatory Visit (HOSPITAL_COMMUNITY): Payer: Self-pay

## 2024-06-13 MED ORDER — ZEPBOUND 7.5 MG/0.5ML ~~LOC~~ SOAJ
7.5000 mg | SUBCUTANEOUS | 3 refills | Status: AC
  Filled 2024-06-13: qty 2, 28d supply, fill #0
  Filled 2024-07-20: qty 2, 28d supply, fill #1

## 2024-06-14 ENCOUNTER — Other Ambulatory Visit: Payer: Self-pay

## 2024-06-14 ENCOUNTER — Other Ambulatory Visit (HOSPITAL_COMMUNITY): Payer: Self-pay

## 2024-06-22 ENCOUNTER — Other Ambulatory Visit (HOSPITAL_COMMUNITY): Payer: Self-pay

## 2024-07-20 ENCOUNTER — Other Ambulatory Visit (HOSPITAL_COMMUNITY): Payer: Self-pay

## 2024-07-20 ENCOUNTER — Other Ambulatory Visit: Payer: Self-pay

## 2024-08-01 ENCOUNTER — Ambulatory Visit (INDEPENDENT_AMBULATORY_CARE_PROVIDER_SITE_OTHER): Admitting: Vascular Surgery

## 2024-08-01 ENCOUNTER — Encounter (INDEPENDENT_AMBULATORY_CARE_PROVIDER_SITE_OTHER)

## 2024-08-30 ENCOUNTER — Ambulatory Visit (INDEPENDENT_AMBULATORY_CARE_PROVIDER_SITE_OTHER): Admitting: Vascular Surgery

## 2024-08-30 ENCOUNTER — Encounter (INDEPENDENT_AMBULATORY_CARE_PROVIDER_SITE_OTHER)
